# Patient Record
Sex: Female | Born: 1960 | Race: White | Hispanic: No | Smoking: Never smoker
Health system: Southern US, Community
[De-identification: ages and names within clinical notes are randomized; demographics above are authoritative.]

## PROBLEM LIST (undated history)

## (undated) DIAGNOSIS — K219 Gastro-esophageal reflux disease without esophagitis: Secondary | ICD-10-CM

## (undated) DIAGNOSIS — M7661 Achilles tendinitis, right leg: Secondary | ICD-10-CM

## (undated) DIAGNOSIS — M654 Radial styloid tenosynovitis [de Quervain]: Secondary | ICD-10-CM

## (undated) DIAGNOSIS — R519 Headache, unspecified: Secondary | ICD-10-CM

## (undated) DIAGNOSIS — M1990 Unspecified osteoarthritis, unspecified site: Secondary | ICD-10-CM

## (undated) DIAGNOSIS — S62009A Unspecified fracture of navicular [scaphoid] bone of unspecified wrist, initial encounter for closed fracture: Secondary | ICD-10-CM

## (undated) DIAGNOSIS — G4730 Sleep apnea, unspecified: Secondary | ICD-10-CM

## (undated) DIAGNOSIS — S52131A Displaced fracture of neck of right radius, initial encounter for closed fracture: Secondary | ICD-10-CM

## (undated) DIAGNOSIS — I10 Essential (primary) hypertension: Secondary | ICD-10-CM

## (undated) DIAGNOSIS — R7303 Prediabetes: Secondary | ICD-10-CM

## (undated) DIAGNOSIS — M7710 Lateral epicondylitis, unspecified elbow: Secondary | ICD-10-CM

## (undated) DIAGNOSIS — M7541 Impingement syndrome of right shoulder: Secondary | ICD-10-CM

## (undated) DIAGNOSIS — C50919 Malignant neoplasm of unspecified site of unspecified female breast: Secondary | ICD-10-CM

## (undated) DIAGNOSIS — G5762 Lesion of plantar nerve, left lower limb: Secondary | ICD-10-CM

## (undated) DIAGNOSIS — R0600 Dyspnea, unspecified: Secondary | ICD-10-CM

## (undated) DIAGNOSIS — M65332 Trigger finger, left middle finger: Secondary | ICD-10-CM

## (undated) DIAGNOSIS — J189 Pneumonia, unspecified organism: Secondary | ICD-10-CM

## (undated) DIAGNOSIS — Z8489 Family history of other specified conditions: Secondary | ICD-10-CM

## (undated) DIAGNOSIS — C50211 Malignant neoplasm of upper-inner quadrant of right female breast: Secondary | ICD-10-CM

## (undated) DIAGNOSIS — Z87442 Personal history of urinary calculi: Secondary | ICD-10-CM

## (undated) HISTORY — PX: TENOTOMY HAND / FINGER PERCUTANEOUS: SUR1344

## (undated) HISTORY — PX: APPENDECTOMY: SHX54

## (undated) HISTORY — DX: Essential (primary) hypertension: I10

## (undated) HISTORY — PX: WRIST CAPSULOTOMY: SUR192

## (undated) HISTORY — PX: WRIST FRACTURE SURGERY: SHX121

## (undated) HISTORY — PX: JOINT REPLACEMENT: SHX530

## (undated) HISTORY — PX: LITHOTRIPSY: SUR834

## (undated) HISTORY — PX: TUBAL LIGATION: SHX77

---
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
## 2004-10-23 ENCOUNTER — Ambulatory Visit: Payer: Self-pay

## 2007-01-15 ENCOUNTER — Ambulatory Visit: Payer: Self-pay

## 2008-11-13 ENCOUNTER — Ambulatory Visit: Payer: Self-pay | Admitting: Internal Medicine

## 2011-01-10 ENCOUNTER — Ambulatory Visit: Payer: Self-pay | Admitting: Family Medicine

## 2012-05-05 ENCOUNTER — Ambulatory Visit: Payer: Self-pay | Admitting: Family Medicine

## 2012-05-08 DIAGNOSIS — M7710 Lateral epicondylitis, unspecified elbow: Secondary | ICD-10-CM | POA: Insufficient documentation

## 2012-05-08 DIAGNOSIS — M25519 Pain in unspecified shoulder: Secondary | ICD-10-CM | POA: Insufficient documentation

## 2012-07-10 DIAGNOSIS — M7541 Impingement syndrome of right shoulder: Secondary | ICD-10-CM | POA: Insufficient documentation

## 2012-07-29 ENCOUNTER — Emergency Department: Payer: Self-pay | Admitting: Emergency Medicine

## 2013-05-27 DIAGNOSIS — M25649 Stiffness of unspecified hand, not elsewhere classified: Secondary | ICD-10-CM | POA: Insufficient documentation

## 2014-08-12 DIAGNOSIS — M65332 Trigger finger, left middle finger: Secondary | ICD-10-CM | POA: Insufficient documentation

## 2014-08-26 HISTORY — PX: BREAST BIOPSY: SHX20

## 2014-09-14 ENCOUNTER — Ambulatory Visit: Payer: Self-pay | Admitting: Family Medicine

## 2014-09-26 ENCOUNTER — Ambulatory Visit: Payer: Self-pay | Admitting: Family Medicine

## 2014-10-03 ENCOUNTER — Ambulatory Visit: Payer: Self-pay | Admitting: Family Medicine

## 2014-12-19 LAB — SURGICAL PATHOLOGY

## 2015-05-25 DIAGNOSIS — Z87442 Personal history of urinary calculi: Secondary | ICD-10-CM | POA: Insufficient documentation

## 2015-10-16 ENCOUNTER — Other Ambulatory Visit: Payer: Self-pay | Admitting: Family Medicine

## 2015-10-16 DIAGNOSIS — R928 Other abnormal and inconclusive findings on diagnostic imaging of breast: Secondary | ICD-10-CM

## 2015-11-01 ENCOUNTER — Ambulatory Visit
Admission: RE | Admit: 2015-11-01 | Discharge: 2015-11-01 | Disposition: A | Discharge status: Home / Self Care | Payer: 59 | Source: Ambulatory Visit | Attending: Family Medicine | Admitting: Family Medicine

## 2015-11-01 ENCOUNTER — Other Ambulatory Visit: Payer: Self-pay | Admitting: Family Medicine

## 2015-11-01 DIAGNOSIS — R928 Other abnormal and inconclusive findings on diagnostic imaging of breast: Secondary | ICD-10-CM

## 2015-11-01 DIAGNOSIS — N63 Unspecified lump in breast: Secondary | ICD-10-CM | POA: Diagnosis not present

## 2017-03-03 ENCOUNTER — Other Ambulatory Visit: Payer: Self-pay | Admitting: Family Medicine

## 2017-03-03 DIAGNOSIS — Z1231 Encounter for screening mammogram for malignant neoplasm of breast: Secondary | ICD-10-CM

## 2017-03-13 ENCOUNTER — Ambulatory Visit
Admission: RE | Admit: 2017-03-13 | Discharge: 2017-03-13 | Disposition: A | Discharge status: Home / Self Care | Payer: BLUE CROSS/BLUE SHIELD | Source: Ambulatory Visit | Attending: Family Medicine | Admitting: Family Medicine

## 2017-03-13 DIAGNOSIS — Z1231 Encounter for screening mammogram for malignant neoplasm of breast: Secondary | ICD-10-CM | POA: Diagnosis present

## 2017-11-21 ENCOUNTER — Emergency Department
Admission: EM | Admit: 2017-11-21 | Discharge: 2017-11-21 | Disposition: A | Discharge status: Home / Self Care | Payer: No Typology Code available for payment source | Attending: Emergency Medicine | Admitting: Emergency Medicine

## 2017-11-21 ENCOUNTER — Encounter: Payer: Self-pay | Admitting: Emergency Medicine

## 2017-11-21 ENCOUNTER — Other Ambulatory Visit: Payer: Self-pay

## 2017-11-21 ENCOUNTER — Emergency Department: Payer: No Typology Code available for payment source

## 2017-11-21 DIAGNOSIS — W230XXA Caught, crushed, jammed, or pinched between moving objects, initial encounter: Secondary | ICD-10-CM | POA: Diagnosis not present

## 2017-11-21 DIAGNOSIS — Y9289 Other specified places as the place of occurrence of the external cause: Secondary | ICD-10-CM | POA: Insufficient documentation

## 2017-11-21 DIAGNOSIS — S60222A Contusion of left hand, initial encounter: Secondary | ICD-10-CM

## 2017-11-21 DIAGNOSIS — S6992XA Unspecified injury of left wrist, hand and finger(s), initial encounter: Secondary | ICD-10-CM | POA: Diagnosis present

## 2017-11-21 DIAGNOSIS — Y9389 Activity, other specified: Secondary | ICD-10-CM | POA: Diagnosis not present

## 2017-11-21 DIAGNOSIS — Y999 Unspecified external cause status: Secondary | ICD-10-CM | POA: Diagnosis not present

## 2017-11-21 MED ORDER — IBUPROFEN 800 MG PO TABS
800.0000 mg | ORAL_TABLET | Freq: Once | ORAL | Status: AC
Start: 2017-11-21 — End: 2017-11-21
  Administered 2017-11-21: 800 mg via ORAL
  Filled 2017-11-21: qty 1

## 2017-11-21 NOTE — ED Triage Notes (Signed)
Patient ambulatory to triage with steady gait, without difficulty or distress noted; pt reports injuring left hand while at work; st pain to base of thumb; pt employeed with [employer] ([employer] required per profile)

## 2017-11-21 NOTE — ED Notes (Signed)
Urine collected and released to lab following chain of custody protocol for [facility] pickup.  Specimen id no [id number]

## 2017-11-21 NOTE — ED Provider Notes (Signed)
[facility] Emergency Department Provider Note   ____________________________________________   First MD Initiated Contact with Patient 11/21/17 [phone]     (approximate)  I have reviewed the triage vital signs and the nursing notes.   HISTORY  Chief Complaint Hand Injury    HPI Lisa Jennings is a 57 y.o. female who presents to the ED from work with a chief complaint of left hand injury/pain.  Patient is right-hand dominant.  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  Complains of pain and swelling to her thenar eminence.  Denies other injuries.   Past medical history Kidney stones  There are no active problems to display for this patient.   Past Surgical History:  Procedure Laterality Date  . APPENDECTOMY    . BREAST BIOPSY Right 2016   neg/ bx -clip  . LITHOTRIPSY    . TUBAL LIGATION    . WRIST FRACTURE SURGERY      Prior to Admission medications   Not on File    Allergies Patient has no known allergies.  Family History  Problem Relation Age of Onset  . Breast cancer Maternal Aunt 64    Social History Social History   Tobacco Use  . Smoking status: Never Smoker  . Smokeless tobacco: Never Used  Substance Use Topics  . Alcohol use: Not on file  . Drug use: Not on file    Review of Systems  Constitutional: No fever/chills. Eyes: No visual changes. ENT: No sore throat. Cardiovascular: Denies chest pain. Respiratory: Denies shortness of breath. Gastrointestinal: No abdominal pain.  No nausea, no vomiting.  No diarrhea.  No constipation. Genitourinary: Negative for dysuria. Musculoskeletal: Positive for left hand pain/injury.  Negative for back pain. Skin: Negative for rash. Neurological: Negative for headaches, focal weakness or numbness.   ____________________________________________   PHYSICAL EXAM:  VITAL SIGNS: ED Triage Vitals  Enc Vitals Group     BP  11/21/17 0324 (!) 165/83     Pulse Rate 11/21/17 0324 82     Resp 11/21/17 0324 20     Temp 11/21/17 0324 97.9 F (36.6 C)     Temp Source 11/21/17 0324 Oral     SpO2 11/21/17 0324 99 %     Weight 11/21/17 0321 220 lb (99.8 kg)     Height 11/21/17 0321 5\' 4"  (1.626 m)     Head Circumference --      Peak Flow --      Pain Score 11/21/17 0321 10     Pain Loc --      Pain Edu? --      Excl. in [place]? --     Constitutional: Alert and oriented. Well appearing and in no acute distress. Eyes: Conjunctivae are normal. PERRL. EOMI. Head: Atraumatic. Nose: No congestion/rhinnorhea. Mouth/Throat: Mucous membranes are moist.  Oropharynx non-erythematous. Neck: No stridor.  No cervical spine tenderness to palpation. Cardiovascular: Normal rate, regular rhythm. Grossly normal heart sounds.  Good peripheral circulation. Respiratory: Normal respiratory effort.  No retractions. Lungs CTAB. Gastrointestinal: Soft and nontender. No distention. No abdominal bruits. No CVA tenderness. Musculoskeletal:  Left palm: Mild swelling and redness to thenar eminence.  No break in skin, no lacerations or abrasions.  Limited range of motion of left thumb secondary to pain.  2+ radial pulse.  Brisk, less than 5-second capillary refill. Neurologic:  Normal speech and language. No gross focal neurologic deficits are appreciated. No gait instability. Skin:  Skin is warm, dry and intact. No rash noted. Psychiatric: Mood and affect are normal. Speech and behavior are normal.  ____________________________________________   LABS (all labs ordered are listed, but only abnormal results are displayed)  Labs Reviewed - No data to display ____________________________________________  EKG  None ____________________________________________  RADIOLOGY  ED MD interpretation: No acute fracture or dislocation  Official radiology report(s): Dg Hand Complete Left  Result Date: 11/21/2017 CLINICAL DATA:  57 year old  female with pain at the base of the left thumb. EXAM: LEFT HAND - COMPLETE 3+ VIEW COMPARISON:  Left wrist radiograph dated 07/29/2012 FINDINGS: There is no acute fracture or dislocation. Old healed fracture of the distal radius. The bones are osteopenic. No significant arthritic changes. The soft tissues appear unremarkable. IMPRESSION: 1. No acute findings. 2. Osteopenia and old healed distal radial fracture. Electronically Signed   By: Anner Crete M.D.   On: 11/21/2017 03:57    ____________________________________________   PROCEDURES  Procedure(s) performed: None  Procedures  Critical Care performed: No  ____________________________________________   INITIAL IMPRESSION / ASSESSMENT AND PLAN / ED COURSE  As part of my medical decision making, I reviewed the following data within the [place] notes reviewed and incorporated, Radiograph reviewed and Notes from prior ED visits   57 year old female who presents with left hand contusion/injury occurred at work.  X-ray negative for acute fracture or dislocation.  Clinical Course as of Nov 21 409  Fri Nov 21, 2017  0408 Updated patient of x-ray result.  Will administer NSAIDs, Ace wrap and patient will follow-up with orthopedics as needed.  Strict return precautions given.  Patient verbalizes understanding and agrees with plan of care.   [JS]    Clinical Course User Index [JS] Paulette Blanch, MD     ____________________________________________   FINAL CLINICAL IMPRESSION(S) / ED DIAGNOSES  Final diagnoses:  Contusion of left hand, initial encounter     ED Discharge Orders    None       Note:  This document was prepared using Dragon voice recognition software and may include unintentional dictation errors.    Paulette Blanch, MD 11/21/17 [phone]

## 2017-11-21 NOTE — Discharge Instructions (Signed)
1.  You may take Tylenol and/or Ibuprofen as needed for pain. 2.  You may remove Ace wrap as needed. 3.  Return to the ER for worsening symptoms, increased pain/swelling or other concerns.

## 2017-11-21 NOTE — ED Notes (Signed)

## 2018-03-11 ENCOUNTER — Other Ambulatory Visit: Payer: Self-pay | Admitting: Family Medicine

## 2018-03-11 DIAGNOSIS — Z1231 Encounter for screening mammogram for malignant neoplasm of breast: Secondary | ICD-10-CM

## 2018-03-25 ENCOUNTER — Ambulatory Visit
Admission: RE | Admit: 2018-03-25 | Discharge: 2018-03-25 | Disposition: A | Discharge status: Home / Self Care | Payer: BLUE CROSS/BLUE SHIELD | Source: Ambulatory Visit | Attending: Family Medicine | Admitting: Family Medicine

## 2018-03-25 DIAGNOSIS — Z1231 Encounter for screening mammogram for malignant neoplasm of breast: Secondary | ICD-10-CM

## 2018-05-26 DIAGNOSIS — I10 Essential (primary) hypertension: Secondary | ICD-10-CM | POA: Insufficient documentation

## 2018-08-26 DIAGNOSIS — Z923 Personal history of irradiation: Secondary | ICD-10-CM

## 2018-08-26 HISTORY — DX: Personal history of irradiation: Z92.3

## 2019-04-08 ENCOUNTER — Other Ambulatory Visit: Payer: Self-pay | Admitting: Family Medicine

## 2019-04-08 DIAGNOSIS — Z1231 Encounter for screening mammogram for malignant neoplasm of breast: Secondary | ICD-10-CM

## 2019-05-04 ENCOUNTER — Ambulatory Visit
Admission: RE | Admit: 2019-05-04 | Discharge: 2019-05-04 | Disposition: A | Discharge status: Home / Self Care | Payer: 59 | Source: Ambulatory Visit | Attending: Family Medicine | Admitting: Family Medicine

## 2019-05-04 ENCOUNTER — Other Ambulatory Visit: Payer: Self-pay

## 2019-05-04 DIAGNOSIS — Z1231 Encounter for screening mammogram for malignant neoplasm of breast: Secondary | ICD-10-CM | POA: Diagnosis present

## 2019-05-06 ENCOUNTER — Other Ambulatory Visit: Payer: Self-pay | Admitting: Family Medicine

## 2019-05-06 DIAGNOSIS — R928 Other abnormal and inconclusive findings on diagnostic imaging of breast: Secondary | ICD-10-CM

## 2019-05-06 DIAGNOSIS — N6310 Unspecified lump in the right breast, unspecified quadrant: Secondary | ICD-10-CM

## 2019-05-14 ENCOUNTER — Ambulatory Visit
Admission: RE | Admit: 2019-05-14 | Discharge: 2019-05-14 | Disposition: A | Discharge status: Home / Self Care | Payer: 59 | Source: Ambulatory Visit | Attending: Family Medicine | Admitting: Family Medicine

## 2019-05-14 ENCOUNTER — Other Ambulatory Visit: Payer: Self-pay

## 2019-05-14 DIAGNOSIS — R928 Other abnormal and inconclusive findings on diagnostic imaging of breast: Secondary | ICD-10-CM

## 2019-05-14 DIAGNOSIS — N6310 Unspecified lump in the right breast, unspecified quadrant: Secondary | ICD-10-CM | POA: Diagnosis present

## 2019-05-17 ENCOUNTER — Other Ambulatory Visit: Payer: Self-pay | Admitting: Family Medicine

## 2019-05-17 DIAGNOSIS — R928 Other abnormal and inconclusive findings on diagnostic imaging of breast: Secondary | ICD-10-CM

## 2019-05-17 DIAGNOSIS — N6489 Other specified disorders of breast: Secondary | ICD-10-CM

## 2019-05-21 ENCOUNTER — Other Ambulatory Visit: Payer: Self-pay | Admitting: Family Medicine

## 2019-05-21 DIAGNOSIS — N6489 Other specified disorders of breast: Secondary | ICD-10-CM

## 2019-05-21 DIAGNOSIS — R928 Other abnormal and inconclusive findings on diagnostic imaging of breast: Secondary | ICD-10-CM

## 2019-05-21 DIAGNOSIS — N6310 Unspecified lump in the right breast, unspecified quadrant: Secondary | ICD-10-CM

## 2019-05-28 ENCOUNTER — Ambulatory Visit
Admission: RE | Admit: 2019-05-28 | Discharge: 2019-05-28 | Disposition: A | Discharge status: Home / Self Care | Payer: No Typology Code available for payment source | Source: Ambulatory Visit | Attending: Family Medicine | Admitting: Family Medicine

## 2019-05-28 DIAGNOSIS — N6310 Unspecified lump in the right breast, unspecified quadrant: Secondary | ICD-10-CM | POA: Diagnosis present

## 2019-05-28 DIAGNOSIS — N6489 Other specified disorders of breast: Secondary | ICD-10-CM | POA: Diagnosis present

## 2019-05-28 DIAGNOSIS — R928 Other abnormal and inconclusive findings on diagnostic imaging of breast: Secondary | ICD-10-CM | POA: Diagnosis not present

## 2019-05-28 HISTORY — PX: BREAST BIOPSY: SHX20

## 2019-05-31 ENCOUNTER — Other Ambulatory Visit: Payer: Self-pay | Admitting: Anatomic Pathology & Clinical Pathology

## 2019-06-01 ENCOUNTER — Other Ambulatory Visit: Payer: Self-pay

## 2019-06-01 DIAGNOSIS — C50919 Malignant neoplasm of unspecified site of unspecified female breast: Secondary | ICD-10-CM

## 2019-06-02 ENCOUNTER — Other Ambulatory Visit: Payer: Self-pay

## 2019-06-03 ENCOUNTER — Ambulatory Visit: Payer: Self-pay | Admitting: General Surgery

## 2019-06-03 ENCOUNTER — Other Ambulatory Visit: Payer: Self-pay | Admitting: General Surgery

## 2019-06-03 DIAGNOSIS — C50211 Malignant neoplasm of upper-inner quadrant of right female breast: Secondary | ICD-10-CM

## 2019-06-03 NOTE — H&P (View-Only) (Signed)
PATIENT PROFILE: Lisa Jennings is a 58 y.o. female who presents to the Clinic for consultation at the request of Dr. Hoy Morn for evaluation of right breast cancer.  PCP:  Dimas Chyle, MD  HISTORY OF PRESENT ILLNESS: [name] reports reported that she went to her screening mammogram.  Suspicious distortion was found.  This led to diagnostic mammogram and ultrasound.  Diagnostic mammogram and ultrasound confirmed a suspicious lesion on the right breast.  This led to a core biopsy.  Core biopsy shows invasive mammary carcinoma of the right breast.  There were no suspicious node on the axilla.  Family history of breast cancer: Aunt Family history of other cancers: none Menarche: 58 years old Menopause: 58 years old Used OCP: none Used estrogen and progesterone therapy: none  History of Radiation to the chest: none Number of pregnancies: 3 Previous biopsy: 1.  Biopsy in the right breast, benign.  PROBLEM LIST:         Problem List  Date Reviewed: 06/03/2019         Noted   Essential hypertension 05/26/2018   History of kidney stones 05/25/2015   Trigger middle finger of left hand 08/12/2014   Joint stiffness of hand 05/27/2013   Wrist stiffness 05/27/2013   Impingement syndrome of right shoulder 07/10/2012   Shoulder pain 05/08/2012   Lateral epicondylitis 05/08/2012   BMI 38.0-38.9,adult Unknown      GENERAL REVIEW OF SYSTEMS:   General ROS: negative for - chills, fatigue, fever, weight gain or weight loss Allergy and Immunology ROS: negative for - hives  Hematological and Lymphatic ROS: negative for - bleeding problems or bruising, negative for palpable nodes Endocrine ROS: negative for - heat or cold intolerance, hair changes Respiratory ROS: negative for - cough, shortness of breath or wheezing Cardiovascular ROS: no chest pain or palpitations GI ROS: negative for nausea, vomiting, abdominal pain, diarrhea, constipation Musculoskeletal ROS:  negative for - joint swelling or muscle pain Neurological ROS: negative for - confusion, syncope Dermatological ROS: negative for pruritus and rash Psychiatric: negative for anxiety, depression, difficulty sleeping and memory loss  MEDICATIONS: Current Medications        Current Outpatient Medications  Medication Sig Dispense Refill  . *calcium carbonate oral 1 tab by mouth daily    . acetaminophen (TYLENOL) 500 MG tablet Take 500 mg by mouth every 6 (six) hours.    Marland Kitchen aspirin 81 mg tablet 1 tab by mouth daily    . meloxicam (MOBIC) 15 MG tablet Take 1 tablet (15 mg total) by mouth once daily 90 tablet 1  . multivitamin capsule 1 cap by mouth daily    . valsartan-hydrochlorothiazide (DIOVAN-HCT) 160-12.5 mg tablet Take 1 tablet by mouth once daily 30 tablet 11   No current facility-administered medications for this visit.       ALLERGIES: Patient has no known allergies.  PAST MEDICAL HISTORY:     Past Medical History:  Diagnosis Date  . BMI 38.0-38.9,adult   . Broken wrist 07/29/2013   healing  . GERD (gastroesophageal reflux disease)    OTC meds taken prn  . Hypertension   . Kidney stones 1999   None since then  . Normal cardiac stress test    results normal per patient  . Obesity   . Wears glasses     PAST SURGICAL HISTORY:      Past Surgical History:  Procedure Laterality Date  . APPENDECTOMY    . CAPSULECTOMY/CAPSULECTOMY INTERPHALANGEAL JOINT Left 05/17/2013   Procedure:  CAPSULECTOMY / CAPSULECTOMY INTERPHALANGEAL JOINT -  WRIST & INDEX,LONG,RING,& SMALL FINGERS;  Surgeon: Sallee Provencal, MD;  Location: [facility] OR;  Service: Plastic Surgery;  Laterality: Left;  . CAPSULOTOMY WRIST Left 05/17/2013   Procedure: CAPSULOTOMY WRIST;  Surgeon: Sallee Provencal, MD;  Location: [facility] OR;  Service: Plastic Surgery;  Laterality: Left;  . kidney stone removal    . NEUROPLASTY &/OR TRANSPOSITION MEDIAN NERVE AT CARPAL TUNNEL Left  08/10/2012   Procedure: NEUROPLASTY &/OR TRANSPOSITION MEDIAN NERVE AT CARPAL TUNNEL;  Surgeon: Sallee Provencal, MD;  Location: [facility] OR;  Service: Plastic Surgery;  Laterality: Left;  . TENOTOMY FINGER Left 05/17/2013   Procedure: TENOTOMY FINGER- INDEX,LONG,RING,& SMALL FINGERS;  Surgeon: Sallee Provencal, MD;  Location: [facility] OR;  Service: Plastic Surgery;  Laterality: Left;  . TUBAL LIGATION       FAMILY HISTORY:      Family History  Problem Relation Age of Onset  . High blood pressure (Hypertension) Mother   . Diabetes type II Mother   . Osteoporosis (Thinning of bones) Mother   . Diabetes type II Sister   . High blood pressure (Hypertension) Sister   . Hyperlipidemia (Elevated cholesterol) Sister   . Coronary Artery Disease (Blocked arteries around heart) Brother 59  . Diabetes type II Brother   . Hyperlipidemia (Elevated cholesterol) Sister   . High blood pressure (Hypertension) Sister   . Thyroid disease Sister   . Diverticulitis Sister   . No Known Problems Son   . No Known Problems Son   . No Known Problems Son   . Anesthesia problems Neg Hx   . Malignant hypertension Neg Hx      SOCIAL HISTORY: Social History          Socioeconomic History  . Marital status: Divorced    Spouse name: Not on file  . Number of children: 3  . Years of education: 57  . Highest education level: Not on file  Occupational History  . Occupation: Full-time  Social Needs  . Financial resource strain: Not on file  . Food insecurity    Worry: Not on file    Inability: Not on file  . Transportation needs    Medical: Not on file    Non-medical: Not on file  Tobacco Use  . Smoking status: Never Smoker  . Smokeless tobacco: Never Used  Substance and Sexual Activity  . Alcohol use: No  . Drug use: No  . Sexual activity: Never    Partners: Male    Birth control/protection: Surgical  Other Topics Concern  . Not on file  Social History  Narrative   Divorced.  Has three sons, five grandkids.  No tob, etoh, drugs.  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  Exercise: some walking.   Wears seatbelts, sunscreen.  Dentist: yes. Calcium in diet: yes.  No relgious beliefs affecting health care.  Advanced Directives: will work on.        PHYSICAL EXAM:    Vitals:   06/03/19 0831  BP: 154/77  Pulse: 81   Body mass index is 39.86 kg/m. Weight: (!) 107 kg (235 lb 14 oz)   GENERAL: Alert, active, oriented x3  HEENT: Pupils equal reactive to light. Extraocular movements are intact. Sclera clear. Palpebral conjunctiva normal red color.Pharynx clear.  NECK: Supple with no palpable mass and no adenopathy.  LUNGS: Sound clear with no rales rhonchi or wheezes.  HEART: Regular rhythm S1 and S2 without murmur.  BREAST: breasts appear  normal, no suspicious masses, no skin or nipple changes or axillary nodes.  ABDOMEN: Soft and depressible, nontender with no palpable mass, no hepatomegaly.  EXTREMITIES: Well-developed well-nourished symmetrical with no dependent edema.  NEUROLOGICAL: Awake alert oriented, facial expression symmetrical, moving all extremities.  REVIEW OF DATA: I have reviewed the following data today:      Appointment on 04/21/2019  Component Date Value  . Sodium 04/21/2019 138   . Potassium 04/21/2019 3.7   . Chloride 04/21/2019 103   . Carbon Dioxide (CO2) 04/21/2019 23   . Urea Nitrogen (BUN) 04/21/2019 15   . Creatinine 04/21/2019 0.9   . Glucose 04/21/2019 82   . Calcium 04/21/2019 9.9   . Anion Gap 04/21/2019 12   . BUN/CREA Ratio 04/21/2019 17   . Glomerular Filtration Ra* 04/21/2019 71   Office Visit on 04/07/2019  Component Date Value  . Sodium 04/07/2019 140   . Potassium 04/07/2019 4.1   . Chloride 04/07/2019 106   . Carbon Dioxide (CO2) 04/07/2019 23   . Urea Nitrogen (BUN) 04/07/2019 17   . Creatinine 04/07/2019 0.9   . Glucose 04/07/2019 84   . Calcium 04/07/2019 10.3*  .  Anion Gap 04/07/2019 11   . BUN/CREA Ratio 04/07/2019 19   . Glomerular Filtration Ra* 04/07/2019 71      ASSESSMENT: Ms. Goldsmith is a 58 y.o. female presenting for consultation for right breast cancer.    Patient was oriented again about the pathology results. Surgical alternatives were discussed with patient including partial vs total mastectomy. Surgical technique and post operative care was discussed with patient. Risk of surgery was discussed with patient including but not limited to: wound infection, seroma, hematoma, brachial plexopathy, mondor's disease (thrombosis of small veins of breast), chronic wound pain, breast lymphedema, altered sensation to the nipple and cosmesis among others.   Patient has appointment with oncology next Monday.  ER PR HER-2 receptors still in process.  After evaluation by oncology, will recommend partial mastectomy with sentinel needle biopsy.  Malignant neoplasm of upper-inner quadrant of right female breast, unspecified estrogen receptor status (CMS-HCC) [C50.211]  PLAN: 1.  Right breast partial mastectomy with sentinel lymph node biopsy ([id number], [id number]) 2.  CBC and CMP 3.  Avoid taking aspirin 5 days before the surgery 4.  Expect oncology appointment on June 07, 2019. 5.  Contact us if you have any question or concern  Patient verbalized understanding, all questions were answered, and were agreeable with the plan outlined above.     Herbert Pun, MD  Electronically signed by Herbert Pun, MD

## 2019-06-03 NOTE — H&P (Signed)
PATIENT PROFILE: Lisa Jennings is a 58 y.o. female who presents to the Clinic for consultation at the request of Dr. Hoy Morn for evaluation of right breast cancer.  PCP:  Dimas Chyle, MD  HISTORY OF PRESENT ILLNESS: Lisa Jennings reports reported that she went to her screening mammogram.  Suspicious distortion was found.  This led to diagnostic mammogram and ultrasound.  Diagnostic mammogram and ultrasound confirmed a suspicious lesion on the right breast.  This led to a core biopsy.  Core biopsy shows invasive mammary carcinoma of the right breast.  There were no suspicious node on the axilla.  Family history of breast cancer: Aunt Family history of other cancers: none Menarche: 58 years old Menopause: 58 years old Used OCP: none Used estrogen and progesterone therapy: none  History of Radiation to the chest: none Number of pregnancies: 3 Previous biopsy: 1.  Biopsy in the right breast, benign.  PROBLEM LIST:         Problem List  Date Reviewed: 06/03/2019         Noted   Essential hypertension 05/26/2018   History of kidney stones 05/25/2015   Trigger middle finger of left hand 08/12/2014   Joint stiffness of hand 05/27/2013   Wrist stiffness 05/27/2013   Impingement syndrome of right shoulder 07/10/2012   Shoulder pain 05/08/2012   Lateral epicondylitis 05/08/2012   BMI 38.0-38.9,adult Unknown      GENERAL REVIEW OF SYSTEMS:   General ROS: negative for - chills, fatigue, fever, weight gain or weight loss Allergy and Immunology ROS: negative for - hives  Hematological and Lymphatic ROS: negative for - bleeding problems or bruising, negative for palpable nodes Endocrine ROS: negative for - heat or cold intolerance, hair changes Respiratory ROS: negative for - cough, shortness of breath or wheezing Cardiovascular ROS: no chest pain or palpitations GI ROS: negative for nausea, vomiting, abdominal pain, diarrhea, constipation Musculoskeletal ROS:  negative for - joint swelling or muscle pain Neurological ROS: negative for - confusion, syncope Dermatological ROS: negative for pruritus and rash Psychiatric: negative for anxiety, depression, difficulty sleeping and memory loss  MEDICATIONS: Current Medications        Current Outpatient Medications  Medication Sig Dispense Refill  . *calcium carbonate oral 1 tab by mouth daily    . acetaminophen (TYLENOL) 500 MG tablet Take 500 mg by mouth every 6 (six) hours.    Marland Kitchen aspirin 81 mg tablet 1 tab by mouth daily    . meloxicam (MOBIC) 15 MG tablet Take 1 tablet (15 mg total) by mouth once daily 90 tablet 1  . multivitamin capsule 1 cap by mouth daily    . valsartan-hydrochlorothiazide (DIOVAN-HCT) 160-12.5 mg tablet Take 1 tablet by mouth once daily 30 tablet 11   No current facility-administered medications for this visit.       ALLERGIES: Patient has no known allergies.  PAST MEDICAL HISTORY:     Past Medical History:  Diagnosis Date  . BMI 38.0-38.9,adult   . Broken wrist 07/29/2013   healing  . GERD (gastroesophageal reflux disease)    OTC meds taken prn  . Hypertension   . Kidney stones 1999   None since then  . Normal cardiac stress test    results normal per patient  . Obesity   . Wears glasses     PAST SURGICAL HISTORY:      Past Surgical History:  Procedure Laterality Date  . APPENDECTOMY    . CAPSULECTOMY/CAPSULECTOMY INTERPHALANGEAL JOINT Left 05/17/2013   Procedure:  CAPSULECTOMY / CAPSULECTOMY INTERPHALANGEAL JOINT -  WRIST & INDEX,LONG,RING,& SMALL FINGERS;  Surgeon: Sallee Provencal, MD;  Location: [facility];  Service: Plastic Surgery;  Laterality: Left;  . CAPSULOTOMY WRIST Left 05/17/2013   Procedure: CAPSULOTOMY WRIST;  Surgeon: Sallee Provencal, MD;  Location: [facility] OR;  Service: Plastic Surgery;  Laterality: Left;  . kidney stone removal    . NEUROPLASTY &/OR TRANSPOSITION MEDIAN NERVE AT CARPAL TUNNEL Left  08/10/2012   Procedure: NEUROPLASTY &/OR TRANSPOSITION MEDIAN NERVE AT CARPAL TUNNEL;  Surgeon: Sallee Provencal, MD;  Location: [facility];  Service: Plastic Surgery;  Laterality: Left;  . TENOTOMY FINGER Left 05/17/2013   Procedure: TENOTOMY FINGER- INDEX,LONG,RING,& SMALL FINGERS;  Surgeon: Sallee Provencal, MD;  Location: [facility];  Service: Plastic Surgery;  Laterality: Left;  . TUBAL LIGATION       FAMILY HISTORY:      Family History  Problem Relation Age of Onset  . High blood pressure (Hypertension) Mother   . Diabetes type II Mother   . Osteoporosis (Thinning of bones) Mother   . Diabetes type II Sister   . High blood pressure (Hypertension) Sister   . Hyperlipidemia (Elevated cholesterol) Sister   . Coronary Artery Disease (Blocked arteries around heart) Brother 59  . Diabetes type II Brother   . Hyperlipidemia (Elevated cholesterol) Sister   . High blood pressure (Hypertension) Sister   . Thyroid disease Sister   . Diverticulitis Sister   . No Known Problems Son   . No Known Problems Son   . No Known Problems Son   . Anesthesia problems Neg Hx   . Malignant hypertension Neg Hx      SOCIAL HISTORY: Social History          Socioeconomic History  . Marital status: Divorced    Spouse name: Not on file  . Number of children: 3  . Years of education: 57  . Highest education level: Not on file  Occupational History  . Occupation: Full-time  Social Needs  . Financial resource strain: Not on file  . Food insecurity    Worry: Not on file    Inability: Not on file  . Transportation needs    Medical: Not on file    Non-medical: Not on file  Tobacco Use  . Smoking status: Never Smoker  . Smokeless tobacco: Never Used  Substance and Sexual Activity  . Alcohol use: No  . Drug use: No  . Sexual activity: Never    Partners: Male    Birth control/protection: Surgical  Other Topics Concern  . Not on file  Social History  Narrative   Divorced.  Has three sons, five grandkids.  No tob, etoh, drugs.  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  Exercise: some walking.   Wears seatbelts, sunscreen.  Dentist: yes. Calcium in diet: yes.  No relgious beliefs affecting health care.  Advanced Directives: will work on.        PHYSICAL EXAM:    Vitals:   06/03/19 0831  BP: 154/77  Pulse: 81   Body mass index is 39.86 kg/m. Weight: (!) 107 kg (235 lb 14 oz)   GENERAL: Alert, active, oriented x3  HEENT: Pupils equal reactive to light. Extraocular movements are intact. Sclera clear. Palpebral conjunctiva normal red color.Pharynx clear.  NECK: Supple with no palpable mass and no adenopathy.  LUNGS: Sound clear with no rales rhonchi or wheezes.  HEART: Regular rhythm S1 and S2 without murmur.  BREAST: breasts appear  normal, no suspicious masses, no skin or nipple changes or axillary nodes.  ABDOMEN: Soft and depressible, nontender with no palpable mass, no hepatomegaly.  EXTREMITIES: Well-developed well-nourished symmetrical with no dependent edema.  NEUROLOGICAL: Awake alert oriented, facial expression symmetrical, moving all extremities.  REVIEW OF DATA: I have reviewed the following data today:      Appointment on 04/21/2019  Component Date Value  . Sodium 04/21/2019 138   . Potassium 04/21/2019 3.7   . Chloride 04/21/2019 103   . Carbon Dioxide (CO2) 04/21/2019 23   . Urea Nitrogen (BUN) 04/21/2019 15   . Creatinine 04/21/2019 0.9   . Glucose 04/21/2019 82   . Calcium 04/21/2019 9.9   . Anion Gap 04/21/2019 12   . BUN/CREA Ratio 04/21/2019 17   . Glomerular Filtration Ra* 04/21/2019 71   Office Visit on 04/07/2019  Component Date Value  . Sodium 04/07/2019 140   . Potassium 04/07/2019 4.1   . Chloride 04/07/2019 106   . Carbon Dioxide (CO2) 04/07/2019 23   . Urea Nitrogen (BUN) 04/07/2019 17   . Creatinine 04/07/2019 0.9   . Glucose 04/07/2019 84   . Calcium 04/07/2019 10.3*  .  Anion Gap 04/07/2019 11   . BUN/CREA Ratio 04/07/2019 19   . Glomerular Filtration Ra* 04/07/2019 71      ASSESSMENT: Ms. Goldsmith is a 58 y.o. female presenting for consultation for right breast cancer.    Patient was oriented again about the pathology results. Surgical alternatives were discussed with patient including partial vs total mastectomy. Surgical technique and post operative care was discussed with patient. Risk of surgery was discussed with patient including but not limited to: wound infection, seroma, hematoma, brachial plexopathy, mondor's disease (thrombosis of small veins of breast), chronic wound pain, breast lymphedema, altered sensation to the nipple and cosmesis among others.   Patient has appointment with oncology next Monday.  ER PR HER-2 receptors still in process.  After evaluation by oncology, will recommend partial mastectomy with sentinel needle biopsy.  Malignant neoplasm of upper-inner quadrant of right female breast, unspecified estrogen receptor status (CMS-HCC) [C50.211]  PLAN: 1.  Right breast partial mastectomy with sentinel lymph node biopsy (19301, [id number]) 2.  CBC and CMP 3.  Avoid taking aspirin 5 days before the surgery 4.  Expect oncology appointment on June 07, 2019. 5.  Contact us if you have any question or concern  Patient verbalized understanding, all questions were answered, and were agreeable with the plan outlined above.     Herbert Pun, MD  Electronically signed by Herbert Pun, MD

## 2019-06-04 ENCOUNTER — Other Ambulatory Visit: Payer: Self-pay

## 2019-06-04 ENCOUNTER — Other Ambulatory Visit: Payer: Self-pay | Admitting: General Surgery

## 2019-06-04 DIAGNOSIS — C50211 Malignant neoplasm of upper-inner quadrant of right female breast: Secondary | ICD-10-CM

## 2019-06-04 LAB — SURGICAL PATHOLOGY

## 2019-06-07 ENCOUNTER — Inpatient Hospital Stay: Payer: 59 | Attending: Internal Medicine | Admitting: Internal Medicine

## 2019-06-07 ENCOUNTER — Encounter: Payer: Self-pay | Admitting: Internal Medicine

## 2019-06-07 ENCOUNTER — Inpatient Hospital Stay: Payer: 59

## 2019-06-07 ENCOUNTER — Other Ambulatory Visit: Payer: Self-pay

## 2019-06-07 DIAGNOSIS — Z170 Estrogen receptor positive status [ER+]: Secondary | ICD-10-CM

## 2019-06-07 DIAGNOSIS — M549 Dorsalgia, unspecified: Secondary | ICD-10-CM | POA: Insufficient documentation

## 2019-06-07 DIAGNOSIS — C50211 Malignant neoplasm of upper-inner quadrant of right female breast: Secondary | ICD-10-CM

## 2019-06-07 DIAGNOSIS — Z7982 Long term (current) use of aspirin: Secondary | ICD-10-CM | POA: Insufficient documentation

## 2019-06-07 DIAGNOSIS — Z79899 Other long term (current) drug therapy: Secondary | ICD-10-CM | POA: Insufficient documentation

## 2019-06-07 DIAGNOSIS — M2550 Pain in unspecified joint: Secondary | ICD-10-CM | POA: Diagnosis not present

## 2019-06-07 DIAGNOSIS — Z791 Long term (current) use of non-steroidal anti-inflammatories (NSAID): Secondary | ICD-10-CM | POA: Diagnosis not present

## 2019-06-07 HISTORY — DX: Malignant neoplasm of upper-inner quadrant of right female breast: C50.211

## 2019-06-07 NOTE — Research (Signed)
I met with patient todayafter her appointment with Dr. Rogue Bussing to review study "Procurement of Human Biospecimens for the Discovery and Validation of Biomarkers for the Predication, Diagnosis and Management of Disease" sponsored by [employer]. Dr. Rogue Bussing discussed the study with the patient prior to my entry into the patient room.  I further explained the study to patient including purpose of the study, risks/benefits, participation requirements, voluntary participation and reimbursement provided by the study. Patient agreeable to participation. Patient signed consent and HIPPA for study and copy of signed forms given to patient. Per study guidelines, patient was asked if she had Novocain in the past week or had any history of cancer. Patient answered no to both of these questions. (If the patient were to have answered yes, she would be disqualified from study). Lab appointment was scheduled for today after it was explained to patient that physician does not need any lab work and the venipuncture would be only for research purposes.  Patient agreeable to lab draw.  Blood collected for research study and patient was given gift card provided by [facility].  Patient was thanked for her participation.   Lula Olszewski Clinical oncology research associate 06/07/2019 1334

## 2019-06-07 NOTE — Assessment & Plan Note (Addendum)
#  Clinical stage I ER PR positive HER-2 negative breast cancer.   #  I had a long discussion with the patient in general regarding the treatment options of breast cancer including-surgery; adjuvant radiation; role of adjuvant systemic therapy including-chemotherapy antihormone therapy. Patient will need lumpectomy with sentinel lymph node evaluation; followed by radiation.  Patient awaiting surgery plan on October 16.  # Based on preoperative clinical characteristics-it is unlikely the patient will need chemotherapy.  Decision regarding chemotherapy based on final surgical pathology/gene assay. Patient will benefit from antihormone therapy.  I discussed the potential benefits of each option; and also potential downsides in detail.  Also discussed the tumor conference.  #Discussed with the patient the available clinical trial- the [employer] study (the blood study) to this patient.  This is a noninterventional study.  Patient interested.  Patient will have labs done today.  Discussed with Marlowe Kays.  # Thank you for allowing me to participate in the care of your pleasant patient. Please do not hesitate to contact me with questions or concerns in the interim. Discussed with Dr.Cintron.   # DISPOSITION:  # labs/clinical trial today # follow up 3 weeks- MD; no labs- Dr.B

## 2019-06-07 NOTE — Progress Notes (Signed)
Patient here today for new consult for breast cancer.

## 2019-06-07 NOTE — Progress Notes (Signed)
[facility] NOTE  Patient Care Team: Hortencia Pilar, MD as PCP - General (Family Medicine)  CHIEF COMPLAINTS/PURPOSE OF CONSULTATION: Breast cancer  #  Oncology History Overview Note  # RIGHT BREAST, UPPER INNER QUADRANT; STEREOTACTIC-GUIDED CORE BIOPSY: - INVASIVE MAMMARY CARCINOMA, NO SPECIAL TYPE.   Size of invasive carcinoma: 5 mm in this sample  Histologic grade of invasive carcinoma: Grade 1                       Glandular/tubular differentiation score: 3                       Nuclear pleomorphism score: 1                       Mitotic rate score: 1                       Total score: 5  Ductal carcinoma in situ: Not identified  Lymphovascular invasion: Not identified   DIAGNOSIS: Right breast cancer  STAGE:    1     ;  GOALS: Cure  CURRENT/MOST RECENT THERAPY : awaiting surgery.     Carcinoma of upper-inner quadrant of right breast in female, estrogen receptor positive ([place])  06/07/2019 Initial Diagnosis   Carcinoma of upper-inner quadrant of right breast in female, estrogen receptor positive ([place])      HISTORY OF PRESENTING ILLNESS:  [name] 58 y.o.  female female with no prior history of breast cancer/or malignancies has been referred to [place] for further evaluation recommendations for new diagnosis of breast cancer.   Patient states she was found to have an abnormal screening mammogram which led to diagnostic mammogram/ultrasound/followed by biopsy-as summarized above.  Patient has chronic mild joint pains back pain.  Not any worse.  No new shortness of breath or new cough.   Review of Systems  Constitutional: Negative for chills, diaphoresis, fever, malaise/fatigue and weight loss.  HENT: Negative for nosebleeds and sore throat.   Eyes: Negative for double vision.  Respiratory: Negative for cough, hemoptysis, sputum production, shortness of breath and wheezing.   Cardiovascular: Negative for chest pain, palpitations, orthopnea  and leg swelling.  Gastrointestinal: Negative for abdominal pain, blood in stool, constipation, diarrhea, heartburn, melena, nausea and vomiting.  Genitourinary: Negative for dysuria, frequency and urgency.  Musculoskeletal: Positive for back pain and joint pain.  Skin: Negative.  Negative for itching and rash.  Neurological: Negative for dizziness, tingling, focal weakness, weakness and headaches.  Endo/Heme/Allergies: Does not bruise/bleed easily.  Psychiatric/Behavioral: Negative for depression. The patient is not nervous/anxious and does not have insomnia.      MEDICAL HISTORY:  Past Medical History:  Diagnosis Date  . Carcinoma of upper-inner quadrant of right breast in female, estrogen receptor positive ([place]) 06/07/2019  . Hypertension     SURGICAL HISTORY: Past Surgical History:  Procedure Laterality Date  . APPENDECTOMY    . BREAST BIOPSY Right 2016   neg/ bx -clip  . BREAST BIOPSY Right 05/28/2019   x clip, stereo bx, pending path   . LITHOTRIPSY    . TUBAL LIGATION    . WRIST FRACTURE SURGERY      SOCIAL HISTORY: Social History   Socioeconomic History  . Marital status: Divorced    Spouse name: Not on file  . Number of children: Not on file  . Years of education: Not on file  .  Highest education level: Not on file  Occupational History  . Not on file  Social Needs  . Financial resource strain: Not on file  . Food insecurity    Worry: Not on file    Inability: Not on file  . Transportation needs    Medical: Not on file    Non-medical: Not on file  Tobacco Use  . Smoking status: Never Smoker  . Smokeless tobacco: Never Used  Substance and Sexual Activity  . Alcohol use: Never    Frequency: Never  . Drug use: Never  . Sexual activity: Not Currently  Lifestyle  . Physical activity    Days per week: Not on file    Minutes per session: Not on file  . Stress: Not on file  Relationships  . Social Herbalist on phone: Not on file    Gets  together: Not on file    Attends religious service: Not on file    Active member of club or organization: Not on file    Attends meetings of clubs or organizations: Not on file    Relationship status: Not on file  . Intimate partner violence    Fear of current or ex partner: Not on file    Emotionally abused: Not on file    Physically abused: Not on file    Forced sexual activity: Not on file  Other Topics Concern  . Not on file  Social History Narrative   Lives in [place]/ with brother/ son& family. 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     FAMILY HISTORY: Family History  Problem Relation Age of Onset  . Breast cancer Maternal Aunt 64  . Diabetes Mother     ALLERGIES:  has No Known Allergies.  MEDICATIONS:  Current Outpatient Medications  Medication Sig Dispense Refill  . acetaminophen (TYLENOL) 500 MG tablet Take 1,000 mg by mouth every 6 (six) hours as needed for moderate pain.    Marland Kitchen aspirin EC 81 MG tablet Take 81 mg by mouth daily.    . Calcium Carb-Cholecalciferol (CALCIUM 600+D) 600-800 MG-UNIT TABS Take 1 tablet by mouth daily.    . meloxicam (MOBIC) 15 MG tablet Take 15 mg by mouth daily.    . Multiple Vitamins-Minerals (CENTRUM SILVER ADULT 50+ PO) Take 1 tablet by mouth daily.    . valsartan-hydrochlorothiazide (DIOVAN-HCT) 160-12.5 MG tablet Take 1 tablet by mouth daily.     No current facility-administered medications for this visit.       Marland Kitchen  PHYSICAL EXAMINATION: ECOG PERFORMANCE STATUS: 0 - Asymptomatic  Vitals:   06/07/19 1138  BP: (!) 143/63  Pulse: 76  Temp: 98.1 F (36.7 C)   Filed Weights   06/07/19 1138  Weight: 239 lb 6.4 oz (108.6 kg)    Physical Exam  Constitutional: She is oriented to person, place, and time and well-developed, well-nourished, and in no distress.  HENT:  Head: Normocephalic and atraumatic.  Mouth/Throat: Oropharynx is clear and moist. No oropharyngeal exudate.  Eyes: Pupils are equal, round, and reactive  to light.  Neck: Normal range of motion. Neck supple.  Cardiovascular: Normal rate and regular rhythm.  Pulmonary/Chest: No respiratory distress. She has no wheezes.  Abdominal: Soft. Bowel sounds are normal. She exhibits no distension and no mass. There is no abdominal tenderness. There is no rebound and no guarding.  Musculoskeletal: Normal range of motion.        General: No tenderness or edema.  Neurological: She is alert and  oriented to person, place, and time.  Skin: Skin is warm.  Psychiatric: Affect normal.     LABORATORY DATA:  I have reviewed the data as listed No results found for: WBC, HGB, HCT, MCV, PLT No results for input(s): NA, K, CL, CO2, GLUCOSE, BUN, CREATININE, CALCIUM, GFRNONAA, GFRAA, PROT, ALBUMIN, AST, ALT, ALKPHOS, BILITOT, BILIDIR, IBILI in the last 8760 hours.  RADIOGRAPHIC STUDIES: I have personally reviewed the radiological images as listed and agreed with the findings in the report. US Breast Ltd Uni Right Inc [place]  Result Date: 05/14/2019 CLINICAL DATA:  Recall from screening mammography with tomosynthesis, possible mass or developing asymmetry involving the INNER RIGHT breast at MIDDLE to POSTERIOR depth EXAM: DIGITAL DIAGNOSTIC RIGHT MAMMOGRAM WITH TOMO ULTRASOUND RIGHT BREAST COMPARISON:  Previous exam(s). ACR Breast Density Category b: There are scattered areas of fibroglandular density. FINDINGS: Tomosynthesis and synthesized spot-compression CC and MLO views of the area of concern in the RIGHT breast were obtained. Spot compression images confirm a mass or focal asymmetry with vague margins and possible subtle architectural distortion in the INNER breast at MIDDLE to POSTERIOR depth, measuring approximately 1.5 cm. It is most conspicuous on the spot compression CC images. On correlative physical examination, there is no palpable abnormality in the INNER RIGHT breast. Targeted RIGHT breast ultrasound is performed, showing no sonographic correlate for the  mammographic mass or asymmetry. Imaging was performed from 1 o'clock through 5 o'clock. IMPRESSION: Suspicious mass or focal asymmetry involving the INNER RIGHT breast at MIDDLE depth without sonographic correlate. RECOMMENDATION: Stereotactic tomosynthesis core needle biopsy of the focal asymmetry or mass involving the RIGHT breast. The stereotactic tomosynthesis biopsy procedure was discussed with patient and her questions were answered. She has agreed to proceed. She will be contacted by the staff at the [facility] and the biopsy will be scheduled at her convenience. I have discussed the findings and recommendations with the patient. BI-RADS CATEGORY  4: Suspicious. Electronically Signed   By: Evangeline Dakin M.D.   On: 05/14/2019 16:14   Mm Diag Breast Tomo Uni Right  Result Date: 05/14/2019 CLINICAL DATA:  Recall from screening mammography with tomosynthesis, possible mass or developing asymmetry involving the INNER RIGHT breast at MIDDLE to POSTERIOR depth EXAM: DIGITAL DIAGNOSTIC RIGHT MAMMOGRAM WITH TOMO ULTRASOUND RIGHT BREAST COMPARISON:  Previous exam(s). ACR Breast Density Category b: There are scattered areas of fibroglandular density. FINDINGS: Tomosynthesis and synthesized spot-compression CC and MLO views of the area of concern in the RIGHT breast were obtained. Spot compression images confirm a mass or focal asymmetry with vague margins and possible subtle architectural distortion in the INNER breast at MIDDLE to POSTERIOR depth, measuring approximately 1.5 cm. It is most conspicuous on the spot compression CC images. On correlative physical examination, there is no palpable abnormality in the INNER RIGHT breast. Targeted RIGHT breast ultrasound is performed, showing no sonographic correlate for the mammographic mass or asymmetry. Imaging was performed from 1 o'clock through 5 o'clock. IMPRESSION: Suspicious mass or focal asymmetry involving the INNER RIGHT breast at MIDDLE depth  without sonographic correlate. RECOMMENDATION: Stereotactic tomosynthesis core needle biopsy of the focal asymmetry or mass involving the RIGHT breast. The stereotactic tomosynthesis biopsy procedure was discussed with patient and her questions were answered. She has agreed to proceed. She will be contacted by the staff at the [facility] and the biopsy will be scheduled at her convenience. I have discussed the findings and recommendations with the patient. BI-RADS CATEGORY  4: Suspicious. Electronically Signed  By: Evangeline Dakin M.D.   On: 05/14/2019 16:14   Mm Clip Placement Right  Result Date: 05/28/2019 CLINICAL DATA:  Post stereotactic guided biopsy an asymmetry/distortion within the slightly upper inner right breast. EXAM: DIAGNOSTIC RIGHT MAMMOGRAM POST STEREOTACTIC BIOPSY COMPARISON:  Previous exam(s). FINDINGS: Mammographic images were obtained following stereotactic guided biopsy of an asymmetry/distortion in the slightly upper inner right breast. An X shaped biopsy marking clip is present and located approximately 1 cm inferior to the biopsied asymmetry/distortion. IMPRESSION: X shaped biopsy marking clip located approximately 1 cm inferior to the biopsied asymmetry/distortion in the right breast. Final Assessment: Post Procedure Mammograms for Marker Placement Electronically Signed   By: Everlean Alstrom M.D.   On: 05/28/2019 08:42   Mm Rt Breast Bx W Loc Dev 1st Lesion Image Bx Spec Stereo Guide  Addendum Date: 06/01/2019   ADDENDUM REPORT: 06/01/2019 14:25 ADDENDUM: PATHOLOGY revealed: A. RIGHT BREAST, UPPER INNER QUADRANT; STEREOTACTIC-GUIDED CORE BIOPSY: - INVASIVE MAMMARY CARCINOMA, NO SPECIAL TYPE. 5 mm in this sample. Grade 1. Ductal carcinoma in situ: Not identified. Lymphovascular invasion: Not identified. Pathology results are CONCORDANT with imaging findings, per Dr. Everlean Alstrom. Pathology results were discussed with patient via telephone. The patient reported doing  well after the biopsy with tenderness at the site. Post biopsy care instructions were reviewed and questions were answered. The patient was encouraged to call [facility] for any additional concerns. Recommendation: Surgical referral. Request for surgical referral was relayed to nurse navigators at [facility] by Electa Sniff RN on 05/31/2019. Addendum by Electa Sniff RN on 06/01/2019. Electronically Signed   By: Everlean Alstrom M.D.   On: 06/01/2019 14:25   Result Date: 06/01/2019 CLINICAL DATA:  Suspicious focal asymmetry/distortion in the upper inner right breast. EXAM: RIGHT BREAST STEREOTACTIC CORE NEEDLE BIOPSY COMPARISON:  Previous exams. FINDINGS: The patient and I discussed the procedure of stereotactic-guided biopsy including benefits and alternatives. We discussed the high likelihood of a successful procedure. We discussed the risks of the procedure including infection, bleeding, tissue injury, clip migration, and inadequate sampling. Informed written consent was given. The usual time out protocol was performed immediately prior to the procedure. Using sterile technique and 1% Lidocaine as local anesthetic, under stereotactic guidance, a 9 gauge vacuum assisted device was used to perform core needle biopsy of the focal asymmetry/distortion in the upper inner right breast using a superior to inferior approach. Lesion quadrant: Upper inner At the conclusion of the procedure, an X shaped tissue marker clip was deployed into the biopsy cavity. Follow-up 2-view mammogram was performed and dictated separately. IMPRESSION: Stereotactic-guided biopsy of the focal asymmetry/distortion in the upper inner right breast. No apparent complications. Electronically Signed: By: Everlean Alstrom M.D. On: 05/28/2019 08:34    ASSESSMENT & PLAN:   Carcinoma of upper-inner quadrant of right breast in female, estrogen receptor positive ([place]) # Clinical stage I ER PR positive HER-2 negative  breast cancer.   #  I had a long discussion with the patient in general regarding the treatment options of breast cancer including-surgery; adjuvant radiation; role of adjuvant systemic therapy including-chemotherapy antihormone therapy. Patient will need lumpectomy with sentinel lymph node evaluation; followed by radiation.  Patient awaiting surgery plan on October 16.  # Based on preoperative clinical characteristics-it is unlikely the patient will need chemotherapy.  Decision regarding chemotherapy based on final surgical pathology/gene assay. Patient will benefit from antihormone therapy.  I discussed the potential benefits of each option; and also potential downsides in detail.  Also discussed the tumor conference.  #Discussed with the patient the available clinical trial- the Bluestar study (the blood study) to this patient.  This is a noninterventional study.  Patient interested.  Patient will have labs done today.  Discussed with Marlowe Kays.  # Thank you for allowing me to participate in the care of your pleasant patient. Please do not hesitate to contact me with questions or concerns in the interim. Discussed with Dr.Cintron.   # DISPOSITION:  # labs/clinical trial today # follow up 3 weeks- MD; no labs- Dr.B   All questions were answered. The patient/family knows to call the clinic with any problems, questions or concerns.    Cammie Sickle, MD 06/07/2019 12:28 PM

## 2019-06-08 ENCOUNTER — Other Ambulatory Visit: Payer: Self-pay

## 2019-06-08 ENCOUNTER — Encounter
Admission: RE | Admit: 2019-06-08 | Discharge: 2019-06-08 | Disposition: A | Discharge status: Home / Self Care | Payer: 59 | Source: Ambulatory Visit | Attending: General Surgery | Admitting: General Surgery

## 2019-06-08 DIAGNOSIS — Z01818 Encounter for other preprocedural examination: Secondary | ICD-10-CM | POA: Diagnosis present

## 2019-06-08 DIAGNOSIS — Z20828 Contact with and (suspected) exposure to other viral communicable diseases: Secondary | ICD-10-CM | POA: Insufficient documentation

## 2019-06-08 DIAGNOSIS — C50211 Malignant neoplasm of upper-inner quadrant of right female breast: Secondary | ICD-10-CM | POA: Insufficient documentation

## 2019-06-08 HISTORY — DX: Gastro-esophageal reflux disease without esophagitis: K21.9

## 2019-06-08 HISTORY — DX: Headache, unspecified: R51.9

## 2019-06-08 HISTORY — DX: Pneumonia, unspecified organism: J18.9

## 2019-06-08 HISTORY — DX: Dyspnea, unspecified: R06.00

## 2019-06-08 HISTORY — DX: Family history of other specified conditions: Z84.89

## 2019-06-08 HISTORY — DX: Personal history of urinary calculi: Z87.442

## 2019-06-08 LAB — SARS CORONAVIRUS 2 (TAT 6-24 HRS): SARS Coronavirus 2: NEGATIVE

## 2019-06-08 NOTE — Patient Instructions (Signed)
Your procedure is scheduled on: Friday 10/16 Report to Mammography. [facility] at 8:45   Remember: Instructions that are not followed completely may result in serious medical risk,  up to and including death, or upon the discretion of your surgeon and anesthesiologist your  surgery may need to be rescheduled.     _X__ 1. Do not eat food after midnight the night before your procedure.                 No gum chewing or hard candies. You may drink clear liquids up to 2 hours                 before you are scheduled to arrive for your surgery- DO not drink clear                 liquids within 2 hours of the start of your surgery.                 Clear Liquids include:  water, apple juice without pulp, clear carbohydrate                 drink such as Clearfast of Gatorade, Black Coffee or Tea (Do not add                 anything to coffee or tea).  __X__2.  On the morning of surgery brush your teeth with toothpaste and water, you                may rinse your mouth with mouthwash if you wish.  Do not swallow any toothpaste of mouthwash.     ___ 3.  No Alcohol for 24 hours before or after surgery.   ___ 4.  Do Not Smoke or use e-cigarettes For 24 Hours Prior to Your Surgery.                 Do not use any chewable tobacco products for at least 6 hours prior to                 surgery.  ____  5.  Bring all medications with you on the day of surgery if instructed.   _x___  6.  Notify your doctor if there is any change in your medical condition      (cold, fever, infections).     Do not wear jewelry, make-up, hairpins, clips or nail polish. Do not wear lotions, powders, or perfumes. You may wear deodorant. Do not shave 48 hours prior to surgery. Men may shave face and neck. Do not bring valuables to the hospital.    [facility] is not responsible for any belongings or valuables.  Contacts, dentures or bridgework may not be worn into surgery. Leave your  suitcase in the car. After surgery it may be brought to your room. For patients admitted to the hospital, discharge time is determined by your treatment team.   Patients discharged the day of surgery will not be allowed to drive home.   Please read over the following fact sheets that you were given:     __x__ Take these medicines the morning of surgery with A SIP OF WATER:    1. acetaminophen (TYLENOL) 500 MG tablet if needed  2.   3.   4.  5.  6.  ____ Fleet Enema (as directed)   __x__ Use CHG Soap as directed  ____ Use inhalers on the day of surgery  ____ Stop metformin  2 days prior to surgery    ____ Take 1/2 of usual insulin dose the night before surgery. No insulin the morning          of surgery.   __x__ Stopped aspirin on Sunday 10/11  __x__ Stop Anti-inflammatories meloxicam (MOBIC) 15 MG tablet today   ____ Stop supplements until after surgery.    _x___ Bring C-Pap to the hospital.     1 part rubbing alcohol 2 parts water in ziplock  Then freeze to use as ice pack   Colace over counter stool softner 2 times a day when taking pain meds.   Bring bra with no underwire

## 2019-06-11 ENCOUNTER — Ambulatory Visit: Payer: No Typology Code available for payment source | Admitting: Certified Registered Nurse Anesthetist

## 2019-06-11 ENCOUNTER — Ambulatory Visit
Admission: RE | Admit: 2019-06-11 | Discharge: 2019-06-11 | Disposition: A | Discharge status: Home / Self Care | Payer: No Typology Code available for payment source | Attending: General Surgery | Admitting: General Surgery

## 2019-06-11 ENCOUNTER — Other Ambulatory Visit: Payer: Self-pay

## 2019-06-11 ENCOUNTER — Ambulatory Visit
Admission: RE | Admit: 2019-06-11 | Discharge: 2019-06-11 | Disposition: A | Discharge status: Home / Self Care | Payer: No Typology Code available for payment source | Source: Ambulatory Visit | Attending: General Surgery | Admitting: General Surgery

## 2019-06-11 ENCOUNTER — Encounter
Admission: RE | Disposition: A | Discharge status: Home / Self Care | Payer: Self-pay | Source: Home / Self Care | Attending: General Surgery

## 2019-06-11 ENCOUNTER — Encounter: Payer: Self-pay | Admitting: *Deleted

## 2019-06-11 ENCOUNTER — Encounter
Admission: RE | Admit: 2019-06-11 | Discharge: 2019-06-11 | Disposition: A | Discharge status: Home / Self Care | Payer: No Typology Code available for payment source | Source: Ambulatory Visit | Attending: General Surgery | Admitting: General Surgery

## 2019-06-11 DIAGNOSIS — K219 Gastro-esophageal reflux disease without esophagitis: Secondary | ICD-10-CM | POA: Insufficient documentation

## 2019-06-11 DIAGNOSIS — E669 Obesity, unspecified: Secondary | ICD-10-CM | POA: Diagnosis not present

## 2019-06-11 DIAGNOSIS — C50211 Malignant neoplasm of upper-inner quadrant of right female breast: Secondary | ICD-10-CM | POA: Diagnosis not present

## 2019-06-11 DIAGNOSIS — Z8249 Family history of ischemic heart disease and other diseases of the circulatory system: Secondary | ICD-10-CM | POA: Diagnosis not present

## 2019-06-11 DIAGNOSIS — Z791 Long term (current) use of non-steroidal anti-inflammatories (NSAID): Secondary | ICD-10-CM | POA: Insufficient documentation

## 2019-06-11 DIAGNOSIS — I10 Essential (primary) hypertension: Secondary | ICD-10-CM | POA: Diagnosis not present

## 2019-06-11 DIAGNOSIS — Z7982 Long term (current) use of aspirin: Secondary | ICD-10-CM | POA: Insufficient documentation

## 2019-06-11 DIAGNOSIS — Z79899 Other long term (current) drug therapy: Secondary | ICD-10-CM | POA: Insufficient documentation

## 2019-06-11 DIAGNOSIS — Z6841 Body Mass Index (BMI) 40.0 and over, adult: Secondary | ICD-10-CM | POA: Insufficient documentation

## 2019-06-11 HISTORY — PX: PARTIAL MASTECTOMY WITH NEEDLE LOCALIZATION AND AXILLARY SENTINEL LYMPH NODE BX: SHX6009

## 2019-06-11 HISTORY — DX: Malignant neoplasm of unspecified site of unspecified female breast: C50.919

## 2019-06-11 HISTORY — PX: BREAST LUMPECTOMY: SHX2

## 2019-06-11 SURGERY — PARTIAL MASTECTOMY WITH NEEDLE LOCALIZATION AND AXILLARY SENTINEL LYMPH NODE BX
Anesthesia: General | Site: Breast | Laterality: Right

## 2019-06-11 MED ORDER — MIDAZOLAM HCL 2 MG/2ML IJ SOLN
INTRAMUSCULAR | Status: AC
  Filled 2019-06-11: qty 2

## 2019-06-11 MED ORDER — ACETAMINOPHEN 10 MG/ML IV SOLN
INTRAVENOUS | Status: DC | PRN
  Administered 2019-06-11: 1000 mg via INTRAVENOUS

## 2019-06-11 MED ORDER — LIDOCAINE HCL (CARDIAC) PF 100 MG/5ML IV SOSY
PREFILLED_SYRINGE | INTRAVENOUS | Status: DC | PRN
  Administered 2019-06-11: 100 mg via INTRAVENOUS

## 2019-06-11 MED ORDER — SUGAMMADEX SODIUM 200 MG/2ML IV SOLN
INTRAVENOUS | Status: AC
  Filled 2019-06-11: qty 2

## 2019-06-11 MED ORDER — TECHNETIUM TC 99M SULFUR COLLOID FILTERED
1.0000 | Freq: Once | INTRAVENOUS | Status: AC | PRN
  Administered 2019-06-11: 0.833 via INTRADERMAL

## 2019-06-11 MED ORDER — DEXMEDETOMIDINE HCL 200 MCG/2ML IV SOLN
INTRAVENOUS | Status: DC | PRN
  Administered 2019-06-11 (×5): 4 ug via INTRAVENOUS

## 2019-06-11 MED ORDER — ROCURONIUM BROMIDE 50 MG/5ML IV SOLN
INTRAVENOUS | Status: AC
  Filled 2019-06-11: qty 1

## 2019-06-11 MED ORDER — FENTANYL CITRATE (PF) 100 MCG/2ML IJ SOLN
INTRAMUSCULAR | Status: DC | PRN
  Administered 2019-06-11: 25 ug via INTRAVENOUS
  Administered 2019-06-11: 50 ug via INTRAVENOUS
  Administered 2019-06-11 (×2): 25 ug via INTRAVENOUS
  Administered 2019-06-11: 50 ug via INTRAVENOUS
  Administered 2019-06-11: 25 ug via INTRAVENOUS

## 2019-06-11 MED ORDER — EPHEDRINE SULFATE 50 MG/ML IJ SOLN
INTRAMUSCULAR | Status: DC | PRN
  Administered 2019-06-11: 10 mg via INTRAVENOUS
  Administered 2019-06-11: 5 mg via INTRAVENOUS
  Administered 2019-06-11: 10 mg via INTRAVENOUS
  Administered 2019-06-11 (×3): 5 mg via INTRAVENOUS

## 2019-06-11 MED ORDER — FENTANYL CITRATE (PF) 100 MCG/2ML IJ SOLN
25.0000 ug | INTRAMUSCULAR | Status: DC | PRN
  Administered 2019-06-11 (×4): 25 ug via INTRAVENOUS

## 2019-06-11 MED ORDER — LACTATED RINGERS IV SOLN
INTRAVENOUS | Status: DC
  Administered 2019-06-11: 10:00:00 via INTRAVENOUS

## 2019-06-11 MED ORDER — FENTANYL CITRATE (PF) 100 MCG/2ML IJ SOLN
INTRAMUSCULAR | Status: AC
  Filled 2019-06-11: qty 2

## 2019-06-11 MED ORDER — DEXAMETHASONE SODIUM PHOSPHATE 10 MG/ML IJ SOLN
INTRAMUSCULAR | Status: DC | PRN
  Administered 2019-06-11: 10 mg via INTRAVENOUS

## 2019-06-11 MED ORDER — PROPOFOL 10 MG/ML IV BOLUS
INTRAVENOUS | Status: DC | PRN
  Administered 2019-06-11: 200 mg via INTRAVENOUS

## 2019-06-11 MED ORDER — PROPOFOL 10 MG/ML IV BOLUS
INTRAVENOUS | Status: AC
  Filled 2019-06-11: qty 20

## 2019-06-11 MED ORDER — SUCCINYLCHOLINE CHLORIDE 20 MG/ML IJ SOLN
INTRAMUSCULAR | Status: AC
  Filled 2019-06-11: qty 1

## 2019-06-11 MED ORDER — CEFAZOLIN SODIUM-DEXTROSE 2-4 GM/100ML-% IV SOLN
2.0000 g | INTRAVENOUS | Status: AC
  Administered 2019-06-11: 11:00:00 2 g via INTRAVENOUS

## 2019-06-11 MED ORDER — FAMOTIDINE 20 MG PO TABS
20.0000 mg | ORAL_TABLET | Freq: Once | ORAL | Status: AC
  Administered 2019-06-11: 11:00:00 20 mg via ORAL

## 2019-06-11 MED ORDER — DEXAMETHASONE SODIUM PHOSPHATE 10 MG/ML IJ SOLN
INTRAMUSCULAR | Status: AC
  Filled 2019-06-11: qty 1

## 2019-06-11 MED ORDER — ACETAMINOPHEN 10 MG/ML IV SOLN
INTRAVENOUS | Status: AC
  Filled 2019-06-11: qty 100

## 2019-06-11 MED ORDER — LIDOCAINE HCL (PF) 2 % IJ SOLN
INTRAMUSCULAR | Status: AC
  Filled 2019-06-11: qty 10

## 2019-06-11 MED ORDER — ONDANSETRON HCL 4 MG/2ML IJ SOLN
INTRAMUSCULAR | Status: DC | PRN
  Administered 2019-06-11: 4 mg via INTRAVENOUS

## 2019-06-11 MED ORDER — FENTANYL CITRATE (PF) 100 MCG/2ML IJ SOLN
INTRAMUSCULAR | Status: AC
  Administered 2019-06-11: 25 ug via INTRAVENOUS
  Filled 2019-06-11: qty 2

## 2019-06-11 MED ORDER — SUGAMMADEX SODIUM 200 MG/2ML IV SOLN
INTRAVENOUS | Status: DC | PRN
  Administered 2019-06-11: 217.2 mg via INTRAVENOUS

## 2019-06-11 MED ORDER — HYDROCODONE-ACETAMINOPHEN 5-325 MG PO TABS
1.0000 | ORAL_TABLET | ORAL | Status: DC | PRN
  Administered 2019-06-11: 1 via ORAL

## 2019-06-11 MED ORDER — PHENYLEPHRINE HCL (PRESSORS) 10 MG/ML IV SOLN
INTRAVENOUS | Status: DC | PRN
  Administered 2019-06-11 (×2): 100 ug via INTRAVENOUS
  Administered 2019-06-11 (×3): 50 ug via INTRAVENOUS
  Administered 2019-06-11 (×2): 100 ug via INTRAVENOUS
  Administered 2019-06-11: 50 ug via INTRAVENOUS

## 2019-06-11 MED ORDER — ONDANSETRON HCL 4 MG/2ML IJ SOLN
INTRAMUSCULAR | Status: AC
  Filled 2019-06-11: qty 2

## 2019-06-11 MED ORDER — BUPIVACAINE-EPINEPHRINE (PF) 0.5% -1:200000 IJ SOLN
INTRAMUSCULAR | Status: DC | PRN
  Administered 2019-06-11: 30 mL

## 2019-06-11 MED ORDER — HYDROCODONE-ACETAMINOPHEN 5-325 MG PO TABS
ORAL_TABLET | ORAL | Status: AC
  Filled 2019-06-11: qty 1

## 2019-06-11 MED ORDER — DEXMEDETOMIDINE HCL IN NACL 80 MCG/20ML IV SOLN
INTRAVENOUS | Status: AC
  Filled 2019-06-11: qty 20

## 2019-06-11 MED ORDER — FAMOTIDINE 20 MG PO TABS
ORAL_TABLET | ORAL | Status: AC
  Administered 2019-06-11: 20 mg via ORAL
  Filled 2019-06-11: qty 1

## 2019-06-11 MED ORDER — CEFAZOLIN SODIUM-DEXTROSE 2-4 GM/100ML-% IV SOLN
INTRAVENOUS | Status: AC
  Filled 2019-06-11: qty 100

## 2019-06-11 MED ORDER — MIDAZOLAM HCL 2 MG/2ML IJ SOLN
INTRAMUSCULAR | Status: DC | PRN
  Administered 2019-06-11: 2 mg via INTRAVENOUS

## 2019-06-11 MED ORDER — HYDROCODONE-ACETAMINOPHEN 5-325 MG PO TABS: 1.0000 | ORAL_TABLET | ORAL | 0 refills | Status: AC | PRN

## 2019-06-11 MED ORDER — EPHEDRINE SULFATE 50 MG/ML IJ SOLN
INTRAMUSCULAR | Status: AC
  Filled 2019-06-11: qty 1

## 2019-06-11 MED ORDER — ROCURONIUM BROMIDE 100 MG/10ML IV SOLN
INTRAVENOUS | Status: DC | PRN
  Administered 2019-06-11: 20 mg via INTRAVENOUS
  Administered 2019-06-11: 50 mg via INTRAVENOUS

## 2019-06-11 MED ORDER — ONDANSETRON HCL 4 MG/2ML IJ SOLN: 4.0000 mg | Freq: Once | INTRAMUSCULAR | Status: DC | PRN

## 2019-06-11 SURGICAL SUPPLY — 51 items
BINDER BREAST LRG (GAUZE/BANDAGES/DRESSINGS) IMPLANT
BINDER BREAST MEDIUM (GAUZE/BANDAGES/DRESSINGS) IMPLANT
BINDER BREAST XLRG (GAUZE/BANDAGES/DRESSINGS) IMPLANT
BINDER BREAST XXLRG (GAUZE/BANDAGES/DRESSINGS) IMPLANT
BLADE SURG 15 STRL LF DISP TIS (BLADE) ×2 IMPLANT
BLADE SURG 15 STRL SS (BLADE) ×4
CANISTER SUCT 1200ML W/VALVE (MISCELLANEOUS) ×3 IMPLANT
CHLORAPREP W/TINT 26 (MISCELLANEOUS) ×3 IMPLANT
CLOSURE WOUND 1/2 X4 (GAUZE/BANDAGES/DRESSINGS) ×1
CNTNR SPEC 2.5X3XGRAD LEK (MISCELLANEOUS)
CONT SPEC 4OZ STER OR WHT (MISCELLANEOUS)
CONTAINER SPEC 2.5X3XGRAD LEK (MISCELLANEOUS) IMPLANT
COVER WAND RF STERILE (DRAPES) ×3 IMPLANT
DERMABOND ADVANCED (GAUZE/BANDAGES/DRESSINGS) ×2
DERMABOND ADVANCED .7 DNX12 (GAUZE/BANDAGES/DRESSINGS) ×1 IMPLANT
DEVICE DUBIN SPECIMEN MAMMOGRA (MISCELLANEOUS) ×3 IMPLANT
DRAPE LAPAROTOMY TRNSV 106X77 (MISCELLANEOUS) ×3 IMPLANT
DRSG GAUZE FLUFF 36X18 (GAUZE/BANDAGES/DRESSINGS) IMPLANT
ELECT CAUTERY BLADE 6.4 (BLADE) ×3 IMPLANT
ELECT REM PT RETURN 9FT ADLT (ELECTROSURGICAL) ×3
ELECTRODE REM PT RTRN 9FT ADLT (ELECTROSURGICAL) ×1 IMPLANT
GAUZE 4X4 16PLY RFD (DISPOSABLE) ×3 IMPLANT
GLOVE BIO SURGEON STRL SZ 6.5 (GLOVE) ×4 IMPLANT
GLOVE BIO SURGEONS STRL SZ 6.5 (GLOVE) ×2
GLOVE BIOGEL PI IND STRL 6.5 (GLOVE) ×1 IMPLANT
GLOVE BIOGEL PI INDICATOR 6.5 (GLOVE) ×2
GOWN STRL REUS W/ TWL LRG LVL3 (GOWN DISPOSABLE) ×2 IMPLANT
GOWN STRL REUS W/TWL LRG LVL3 (GOWN DISPOSABLE) ×4
KIT TURNOVER KIT A (KITS) ×3 IMPLANT
LABEL OR SOLS (LABEL) ×3 IMPLANT
MARGIN MAP 10MM (MISCELLANEOUS) ×3 IMPLANT
MARKER SKIN DUAL TIP RULER LAB (MISCELLANEOUS) ×3 IMPLANT
NEEDLE HYPO 22GX1.5 SAFETY (NEEDLE) ×3 IMPLANT
NEEDLE HYPO 25X1 1.5 SAFETY (NEEDLE) ×3 IMPLANT
PACK BASIN MINOR ARMC (MISCELLANEOUS) ×3 IMPLANT
RETRACTOR RING XSMALL (MISCELLANEOUS) ×1 IMPLANT
RTRCTR WOUND ALEXIS 13CM XS SH (MISCELLANEOUS) ×3
SLEVE PROBE SENORX GAMMA FIND (MISCELLANEOUS) ×3 IMPLANT
STRIP CLOSURE SKIN 1/2X4 (GAUZE/BANDAGES/DRESSINGS) ×2 IMPLANT
SUT ETHILON 3-0 FS-10 30 BLK (SUTURE) ×3
SUT MNCRL 4-0 (SUTURE) ×4
SUT MNCRL 4-0 27XMFL (SUTURE) ×2
SUT PROLENE 3 0 FS 2 (SUTURE) ×6 IMPLANT
SUT SILK 2 0 SH (SUTURE) ×6 IMPLANT
SUT VIC AB 3-0 SH 27 (SUTURE) ×8
SUT VIC AB 3-0 SH 27X BRD (SUTURE) ×4 IMPLANT
SUTURE EHLN 3-0 FS-10 30 BLK (SUTURE) ×1 IMPLANT
SUTURE MNCRL 4-0 27XMF (SUTURE) ×2 IMPLANT
SYR 10ML LL (SYRINGE) ×6 IMPLANT
SYR BULB IRRIG 60ML STRL (SYRINGE) ×3 IMPLANT
WATER STERILE IRR 1000ML POUR (IV SOLUTION) ×3 IMPLANT

## 2019-06-11 NOTE — Anesthesia Postprocedure Evaluation (Signed)
Anesthesia Post Note  Patient: Lisa Jennings  Procedure(s) Performed: PARTIAL MASTECTOMY WITH NEEDLE LOCALIZATION AND AXILLARY SENTINEL LYMPH NODE BX (Right Breast)  Patient location during evaluation: PACU Anesthesia Type: General Level of consciousness: awake and alert Pain management: pain level controlled Vital Signs Assessment: post-procedure vital signs reviewed and stable Respiratory status: spontaneous breathing and respiratory function stable Cardiovascular status: stable Anesthetic complications: no     Last Vitals:  Vitals:   06/11/19 1017 06/11/19 1327  BP: 117/73 (!) 127/58  Pulse: 69 100  Resp: 16 13  Temp: 36.9 C 36.6 C  SpO2: 99% 99%    Last Pain:  Vitals:   06/11/19 1327  PainSc: 0-No pain                 Satrina Magallanes K

## 2019-06-11 NOTE — Anesthesia Post-op Follow-up Note (Signed)
Anesthesia QCDR form completed.        

## 2019-06-11 NOTE — Transfer of Care (Signed)
Immediate Anesthesia Transfer of Care Note  Patient: Lisa Jennings  Procedure(s) Performed: PARTIAL MASTECTOMY WITH NEEDLE LOCALIZATION AND AXILLARY SENTINEL LYMPH NODE BX (Right Breast)  Patient Location: PACU  Anesthesia Type:General  Level of Consciousness: sedated  Airway & Oxygen Therapy: Patient Spontanous Breathing and Patient connected to face mask oxygen  Post-op Assessment: Report given to RN and Post -op Vital signs reviewed and stable  Post vital signs: Reviewed and stable  Last Vitals:  Vitals Value Taken Time  BP 127/58 06/11/19 1328  Temp    Pulse 87 06/11/19 1330  Resp 15 06/11/19 1330  SpO2 98 % 06/11/19 1330  Vitals shown include unvalidated device data.  Last Pain:  Vitals:   06/11/19 1327  PainSc: (P) 0-No pain         Complications: No apparent anesthesia complications

## 2019-06-11 NOTE — Anesthesia Procedure Notes (Signed)
Procedure Name: Intubation Date/Time: 06/11/2019 10:56 AM Performed by: Caryl Asp, CRNA Pre-anesthesia Checklist: Patient identified, Patient being monitored, Timeout performed, Emergency Drugs available and Suction available Patient Re-evaluated:Patient Re-evaluated prior to induction Oxygen Delivery Method: Circle system utilized Preoxygenation: Pre-oxygenation with 100% oxygen Induction Type: IV induction Ventilation: Mask ventilation without difficulty Laryngoscope Size: 3 and McGraph Grade View: Grade I Tube type: Oral Tube size: 7.0 mm Number of attempts: 1 Airway Equipment and Method: Video-laryngoscopy Placement Confirmation: ETT inserted through vocal cords under direct vision,  positive ETCO2 and breath sounds checked- equal and bilateral Secured at: 21 cm Tube secured with: Tape Dental Injury: Teeth and Oropharynx as per pre-operative assessment  Comments: McGraph per provider preference

## 2019-06-11 NOTE — Op Note (Signed)
Preoperative diagnosis: Right breast carcinoma.  Postoperative diagnosis: Right breast carcinoma.   Procedure: Right needle-localized partial mastectomy.                       Right Axillary Sentinel Lymph node biopsy  Anesthesia: GETA  Surgeon: Dr. Windell Moment  Wound Classification: Clean  Indications: Patient is a 58 y.o. female with a nonpalpable right breast mass noted on mammography with core biopsy demonstrating  requires needle-localized partial mastectomy for treatment with sentinel lymph node biopsy.   Findings: 1. Specimen mammography shows marker and wire on specimen 2. Pathology call refers gross examination of margins was ink extended to posterior margin and close to inferior margin 3. No other palpable mass or lymph node identified.   Description of procedure: Preoperative needle localization was performed by radiology. In the nuclear medicine suite, the subareolar region was injected with Tc-99 sulfur colloid. Localization studies were reviewed. The patient was taken to the operating room and placed supine on the operating table, and after general anesthesia the right chest and axilla were prepped and draped in the usual sterile fashion. A time-out was completed verifying correct patient, procedure, site, positioning, and implant(s) and/or special equipment prior to beginning this procedure.  By comparing the localization studies with the direction and skin entry site of the needle, the probable trajectory and location of the mass was visualized. A circumareolar skin incision was planned in such a way as to minimize the amount of dissection to reach the mass.  The skin incision was made. Flaps were raised and the location of the wire confirmed. The wire was delivered into the wound. A 2-0 silk figure-of-eight stay suture was placed around the wire and used for retraction. Dissection was then taken down circumferentially, taking care to include the entire localizing needle and a  wide margin of grossly normal tissue.  The specimen and entire localizing wire were removed. The specimen was oriented and sent to radiology with the localization studies.Intra-Op pathology consult report that the mass extending to the posterior margin and it was a 5 mm from the inferior margin.  Reexcision of the posterior margin which was the pectoralis fascia included muscle was done.  Also reexcision of the inferior and lateral margin were done.  All of them were oriented and sent to pathology.  The wound was irrigated. Hemostasis was checked. The wound was closed with interrupted sutures of 3-0 Vicryl and a subcuticular suture of Monocryl 4-0. No attempt was made to close the dead space. A dressing was applied.  A hand-held gamma probe was used to identify the location of the hottest spot in the axilla. An incision was made around the caudal axillary hairline. Dissection was carried down until subdermal facias was advanced. The probe was placed and again, the point of maximal count was found. Dissection continue until nodule was identified. The node was excised in its entirety. Ex vivo, the node measured 1433 counts when placed on the probe. The bed of the node measured 52 counts. No additional hot spots were identified. No clinically abnormal nodes were palpated. The procedure was terminated. Hemostasis was achieved and the wound closed in layers with deep interrupted 3-0 Vicryl and skin was closed with subcuticular suture of Monocryl 3-0.  The patient tolerated the procedure well and was taken to the postanesthesia care unit in stable condition.   Specimen: Right Breast mass (Orientation markers used: Cranial, Caudal, Medial, Lateral,Skin, Deep)  Sentinel Lymph node                   Re excision of margins: 1. Deep, 2. Inferior, 3. Lateral  Complications: None  Estimated Blood Loss: 30 mL

## 2019-06-11 NOTE — Anesthesia Preprocedure Evaluation (Signed)
Anesthesia Evaluation  Patient identified by MRN, date of birth, ID band Patient awake    Reviewed: Allergy & Precautions, NPO status , Patient's Chart, lab work & pertinent test results  History of Anesthesia Complications (+) Family history of anesthesia reactionNegative for: history of anesthetic complications  Airway Mallampati: III       Dental   Pulmonary neg sleep apnea, neg COPD, Not current smoker,           Cardiovascular hypertension, Pt. on medications (-) Past MI and (-) CHF (-) dysrhythmias (-) Valvular Problems/Murmurs     Neuro/Psych neg Seizures    GI/Hepatic Neg liver ROS, GERD  Medicated and Controlled,  Endo/Other  neg diabetes  Renal/GU negative Renal ROS     Musculoskeletal   Abdominal   Peds  Hematology   Anesthesia Other Findings   Reproductive/Obstetrics                             Anesthesia Physical Anesthesia Plan  ASA: III  Anesthesia Plan: General   Post-op Pain Management:    Induction: Intravenous  PONV Risk Score and Plan: 3 and Dexamethasone, Ondansetron and Midazolam  Airway Management Planned: LMA  Additional Equipment:   Intra-op Plan:   Post-operative Plan:   Informed Consent: I have reviewed the patients History and Physical, chart, labs and discussed the procedure including the risks, benefits and alternatives for the proposed anesthesia with the patient or authorized representative who has indicated his/her understanding and acceptance.       Plan Discussed with:   Anesthesia Plan Comments:         Anesthesia Quick Evaluation

## 2019-06-11 NOTE — Discharge Instructions (Signed)
  Diet: Resume home heart healthy regular diet.   Activity: No heavy lifting >20 pounds (children, pets, laundry, garbage) or strenuous activity until follow-up, but light activity and walking are encouraged. Do not drive or drink alcohol if taking narcotic pain medications.  Wound care: May shower with soapy water and pat dry (do not rub incisions), but no baths or submerging incision underwater until follow-up. (no swimming)   Medications: Resume all home medications. For mild to moderate pain: acetaminophen (Tylenol) or ibuprofen (if no kidney disease). Combining Tylenol with alcohol can substantially increase your risk of causing liver disease. Narcotic pain medications, if prescribed, can be used for severe pain, though may cause nausea, constipation, and drowsiness. Do not combine Tylenol and Norco within a 6 hour period as Norco contains Tylenol. If you do not need the narcotic pain medication, you do not need to fill the prescription.  Call office ([phone]) at any time if any questions, worsening pain, fevers/chills, bleeding, drainage from incision site, or other concerns.   AMBULATORY SURGERY  DISCHARGE INSTRUCTIONS   1) The drugs that you were given will stay in your system until tomorrow so for the next 24 hours you should not:  A) Drive an automobile B) Make any legal decisions C) Drink any alcoholic beverage   2) You may resume regular meals tomorrow.  Today it is better to start with liquids and gradually work up to solid foods.  You may eat anything you prefer, but it is better to start with liquids, then soup and crackers, and gradually work up to solid foods.   3) Please notify your doctor immediately if you have any unusual bleeding, trouble breathing, redness and pain at the surgery site, drainage, fever, or pain not relieved by medication.    4) Additional Instructions:        Please contact your physician with any problems or Same Day Surgery at  [phone], Monday through Friday 6 am to 4 pm, or [place] at [place] Main number at [phone]. 

## 2019-06-11 NOTE — Interval H&P Note (Signed)
History and Physical Interval Note:  06/11/2019 10:35 AM  Lisa Jennings  has presented today for surgery, with the diagnosis of C50.211 malignant neioplasm of upper-inner quadrant of Rt female breast, unspecified estrogen receptorpart.  The various methods of treatment have been discussed with the patient and family. After consideration of risks, benefits and other options for treatment, the patient has consented to  Procedure(s): PARTIAL MASTECTOMY WITH NEEDLE LOCALIZATION AND AXILLARY SENTINEL LYMPH NODE BX (Right) as a surgical intervention.  The patient's history has been reviewed, patient examined, no change in status, stable for surgery.  I have reviewed the patient's chart and labs. Right breast marked in the pre procedure area.  Questions were answered to the patient's satisfaction.     Herbert Pun

## 2019-06-12 ENCOUNTER — Encounter: Payer: Self-pay | Admitting: General Surgery

## 2019-06-16 LAB — SURGICAL PATHOLOGY

## 2019-06-28 ENCOUNTER — Inpatient Hospital Stay: Payer: 59 | Attending: Internal Medicine | Admitting: Internal Medicine

## 2019-06-28 ENCOUNTER — Other Ambulatory Visit: Payer: Self-pay

## 2019-06-28 ENCOUNTER — Encounter: Payer: Self-pay | Admitting: Internal Medicine

## 2019-06-28 DIAGNOSIS — C50211 Malignant neoplasm of upper-inner quadrant of right female breast: Secondary | ICD-10-CM | POA: Diagnosis not present

## 2019-06-28 DIAGNOSIS — Z170 Estrogen receptor positive status [ER+]: Secondary | ICD-10-CM

## 2019-06-28 NOTE — Assessment & Plan Note (Addendum)
#  Stage I ER PR positive HER-2 negative breast cancer s/p Lumpectomy & SLNBx.  Given G-1/ no LVI; would not recommend Oncotype testing.  Reviewed the pathology/staging in detail.  Overall good prognosis discussed.  # I would recommend proceeding with postlumpectomy radiation; will make a referral to radiation oncology.  Discussed the potential radiation side effects/radiation schedule.   #With regards to systemic therapy-I would not recommend chemotherapy/or checking for Oncotype-for reasons above.  Benefit from chemotherapy will be extremely low based on clinical characteristics.  Recommend proceeding with antihormone therapy/after radiation.   #Discussed the mechanism of action of aromatase inhibitors-with blocking of estrogen to prevent breast cancer.  Also discussed the potential side effects including but not limited to arthralgias hot flashes. Would give script for femara at next visit. Will order BMD at next visit.   #Right breast post lumpectomy serous drainage-expected post surgery; if worse recommend follow-up with surgery.  #Reviewed the above plan of care with the patient/and the daughter Lexine Baton at length.  They are in agreement.  # DISPOSITION:  # referral to Dr.Crystal re: breast cancer # follow up 6 weeks- MD; no labs- Dr.B

## 2019-06-28 NOTE — Progress Notes (Signed)
I connected with Lisa Jennings on 06/28/19 at 10:45 AM EST by video enabled telemedicine visit and verified that I am speaking with the correct person using two identifiers.  I discussed the limitations, risks, security and privacy concerns of performing an evaluation and management service by telemedicine and the availability of in-person appointments. I also discussed with the patient that there may be a patient responsible charge related to this service. The patient expressed understanding and agreed to proceed.    Other persons participating in the visit and their role in the encounter: RN/medical reconciliation Patient's location: Home Provider's location: Home  Oncology History Overview Note  # OCT 2020-RIGHT BREAST, UPPER INNER QUADRANT;  INVASIVE MAMMARY CARCINOMA, NO SPECIAL TYPE. S/p Lumpectomy & SLNBx [Dr.Cintron]pT1b pN0; G-1; ER/PR >90%; her 2neu-NEG;   DIAGNOSIS: Right breast cancer  STAGE:    1     ;  GOALS: Cure  CURRENT/MOST RECENT THERAPY : awaiting RT.     Carcinoma of upper-inner quadrant of right breast in female, estrogen receptor positive ([place])  06/07/2019 Initial Diagnosis   Carcinoma of upper-inner quadrant of right breast in female, estrogen receptor positive ([place])    Chief Complaint: Breast cancer  History of present illness:Lisa Jennings 58 y.o.  female with history of breast cancer ER/PR positive HER-2 negative s/p surgery is here for follow-up.  Patient is recovering fairly well from surgery.  Patient complains of serous drainage from the postlumpectomy drainage site.  Otherwise no erythema.  No fevers or chills.  No significant pain.  Patient states that she was recently evaluated by surgery with regards to the drainage.  She notes the drainage is improved since evaluation with surgery.  Observation/objective: Pathology as discussed below  Assessment and plan: Carcinoma of upper-inner quadrant of right breast in female, estrogen  receptor positive ([place]) #  Stage I ER PR positive HER-2 negative breast cancer s/p Lumpectomy & SLNBx.  Given G-1/ no LVI; would not recommend Oncotype testing.  Reviewed the pathology/staging in detail.  Overall good prognosis discussed.  # I would recommend proceeding with postlumpectomy radiation; will make a referral to radiation oncology.  Discussed the potential radiation side effects/radiation schedule.   #With regards to systemic therapy-I would not recommend chemotherapy/or checking for Oncotype-for reasons above.  Benefit from chemotherapy will be extremely low based on clinical characteristics.  Recommend proceeding with antihormone therapy/after radiation.   #Discussed the mechanism of action of aromatase inhibitors-with blocking of estrogen to prevent breast cancer.  Also discussed the potential side effects including but not limited to arthralgias hot flashes. Would give script for femara at next visit. Will order BMD at next visit.   #Right breast post lumpectomy serous drainage-expected post surgery; if worse recommend follow-up with surgery.  #Reviewed the above plan of care with the patient/and the daughter Lisa Jennings at length.  They are in agreement.  # DISPOSITION:  # referral to Dr.Crystal re: breast cancer # follow up 6 weeks- MD; no labs- Dr.B   Follow-up instructions:  I discussed the assessment and treatment plan with the patient.  The patient was provided an opportunity to ask questions and all were answered.  The patient agreed with the plan and demonstrated understanding of instructions.  The patient was advised to call back or seek an in person evaluation if the symptoms worsen or if the condition fails to improve as anticipated.  Dr. Charlaine Dalton [place] at [facility] 06/28/2019 11:24 AM

## 2019-07-02 ENCOUNTER — Other Ambulatory Visit: Payer: Self-pay

## 2019-07-05 ENCOUNTER — Ambulatory Visit
Admission: RE | Admit: 2019-07-05 | Discharge: 2019-07-05 | Disposition: A | Discharge status: Home / Self Care | Payer: 59 | Source: Ambulatory Visit | Attending: Radiation Oncology | Admitting: Radiation Oncology

## 2019-07-05 ENCOUNTER — Encounter: Payer: Self-pay | Admitting: Radiation Oncology

## 2019-07-05 ENCOUNTER — Other Ambulatory Visit: Payer: Self-pay

## 2019-07-05 VITALS — BP 139/82 | HR 71 | Temp 98.0°F | Resp 16 | Wt 240.1 lb

## 2019-07-05 DIAGNOSIS — Z79899 Other long term (current) drug therapy: Secondary | ICD-10-CM | POA: Insufficient documentation

## 2019-07-05 DIAGNOSIS — K219 Gastro-esophageal reflux disease without esophagitis: Secondary | ICD-10-CM | POA: Diagnosis not present

## 2019-07-05 DIAGNOSIS — I10 Essential (primary) hypertension: Secondary | ICD-10-CM | POA: Diagnosis not present

## 2019-07-05 DIAGNOSIS — C50211 Malignant neoplasm of upper-inner quadrant of right female breast: Secondary | ICD-10-CM | POA: Diagnosis not present

## 2019-07-05 DIAGNOSIS — Z7982 Long term (current) use of aspirin: Secondary | ICD-10-CM | POA: Diagnosis not present

## 2019-07-05 DIAGNOSIS — Z170 Estrogen receptor positive status [ER+]: Secondary | ICD-10-CM | POA: Insufficient documentation

## 2019-07-05 NOTE — Consult Note (Signed)
NEW PATIENT EVALUATION  Name: Lisa Jennings  MRN: [id number]  Date:   07/05/2019     DOB: 02-23-1961   This 58 y.o. female patient presents to the clinic for initial evaluation of stage I (T1b N0 M0) ER/PR positive HER-2 negative invasive mammary carcinoma of the right breast status post wide local excision and sentinel node biopsy.  REFERRING PHYSICIAN: Hortencia Pilar, MD  CHIEF COMPLAINT:  Chief Complaint  Patient presents with  . Breast Cancer    Initial consultation    DIAGNOSIS: The encounter diagnosis was Carcinoma of upper-inner quadrant of right breast in female, estrogen receptor positive ([place]).   PREVIOUS INVESTIGATIONS:  Mammogram and ultrasound reviewed Pathology report reviewed Clinical notes reviewed  HPI: Patient is a 58 year old female who presented with an abnormal mammogram of her right breast showing focal asymmetry or suspicious mass in the inner right breast at middle depth with sonographic correlation.  She underwent stereotactic tomosynthesis core biopsy showing invasive mammary carcinoma.  She went on to have a wide local excision and sentinel node biopsy showing a 1 x 0.8 x 0.8 cm invasive mammary carcinoma overall grade 1 with margins clear after reexcision with closest margin at 8 mm.  She had 2 sentinel lymph nodes examined both negative for metastatic disease.  Again tumor was strongly ER/PR positive.  She has had some slow healing with now some radiate granulation tissue in the wound site which is somewhat uncomfortable and comfortable although she is tolerating her healing well.  She specifically [place] cough or bone pain.  She has been seen by medical oncology.  Based on the grade 1 disease no lymphovascular invasion Oncotype DX was not recommended and systemic chemotherapy also was not recommended.  She is seen today for radiation oncology consultation.  Still having healing problems with her wound as described above.  PLANNED TREATMENT REGIMEN:  Right whole breast radiation  PAST MEDICAL HISTORY:  has a past medical history of Breast cancer ([place]), Carcinoma of upper-inner quadrant of right breast in female, estrogen receptor positive ([place]) (06/07/2019), Dyspnea, Family history of adverse reaction to anesthesia, GERD (gastroesophageal reflux disease), Headache, History of kidney stones, Hypertension, and Pneumonia.    PAST SURGICAL HISTORY:  Past Surgical History:  Procedure Laterality Date  . APPENDECTOMY    . BREAST BIOPSY Right 2016   neg/ bx -clip  . BREAST BIOPSY Right 05/28/2019   x clip, stereo bx, invasive mammary carcinoma   . BREAST LUMPECTOMY Right 06/11/2019  . LITHOTRIPSY    . PARTIAL MASTECTOMY WITH NEEDLE LOCALIZATION AND AXILLARY SENTINEL LYMPH NODE BX Right 06/11/2019   Procedure: PARTIAL MASTECTOMY WITH NEEDLE LOCALIZATION AND AXILLARY SENTINEL LYMPH NODE BX;  Surgeon: Herbert Pun, MD;  Location: [facility] ORS;  Service: General;  Laterality: Right;  . TUBAL LIGATION    . WRIST FRACTURE SURGERY      FAMILY HISTORY: family history includes Breast cancer (age of onset: 65) in her maternal aunt; Diabetes in her mother.  SOCIAL HISTORY:  reports that she has never smoked. She has never used smokeless tobacco. She reports that she does not drink alcohol or use drugs.  ALLERGIES: Patient has no known allergies.  MEDICATIONS:  Current Outpatient Medications  Medication Sig Dispense Refill  . acetaminophen (TYLENOL) 500 MG tablet Take 1,000 mg by mouth every 6 (six) hours as needed for moderate pain.    Marland Kitchen aspirin EC 81 MG tablet Take 81 mg by mouth daily.    . Calcium Carb-Cholecalciferol (CALCIUM 600+D) 600-800 MG-UNIT TABS  Take 1 tablet by mouth daily.    . meloxicam (MOBIC) 15 MG tablet Take 15 mg by mouth daily.    . Multiple Vitamins-Minerals (CENTRUM SILVER ADULT 50+ PO) Take 1 tablet by mouth daily.    . valsartan-hydrochlorothiazide (DIOVAN-HCT) 160-12.5 MG tablet Take 1 tablet by mouth daily.     No  current facility-administered medications for this encounter.     ECOG PERFORMANCE STATUS:  1 - Symptomatic but completely ambulatory  REVIEW OF SYSTEMS: Patient denies any weight loss, fatigue, weakness, fever, chills or night sweats. Patient denies any loss of vision, blurred vision. Patient denies any ringing  of the ears or hearing loss. No irregular heartbeat. Patient denies heart murmur or history of fainting. Patient denies any chest pain or pain radiating to her upper extremities. Patient denies any shortness of breath, difficulty breathing at night, cough or hemoptysis. Patient denies any swelling in the lower legs. Patient denies any nausea vomiting, vomiting of blood, or coffee ground material in the vomitus. Patient denies any stomach pain. Patient states has had normal bowel movements no significant constipation or diarrhea. Patient denies any dysuria, hematuria or significant nocturia. Patient denies any problems walking, swelling in the joints or loss of balance. Patient denies any skin changes, loss of hair or loss of weight. Patient denies any excessive worrying or anxiety or significant depression. Patient denies any problems with insomnia. Patient denies excessive thirst, polyuria, polydipsia. Patient denies any swollen glands, patient denies easy bruising or easy bleeding. Patient denies any recent infections, allergies or URI. Patient "s visual fields have not changed significantly in recent time.   PHYSICAL EXAM: BP 139/82 (BP Location: Left Arm, Patient Position: Sitting)   Pulse 71   Temp 98 F (36.7 C) (Tympanic)   Resp 16   Wt 240 lb 1.6 oz (108.9 kg)   BMI 40.58 kg/m  Patient has a large pendulous breasts.  She has a lumpectomy incision which has some granulation tissue in it is still healing slowly in the right lower inner quadrant.  No other dominant mass or nodularity is noted in either breast in 2 positions examined.  No axillary or supraclavicular adenopathy is  identified.  Well-developed well-nourished patient in NAD. HEENT reveals PERLA, EOMI, discs not visualized.  Oral cavity is clear. No oral mucosal lesions are identified. Neck is clear without evidence of cervical or supraclavicular adenopathy. Lungs are clear to A&P. Cardiac examination is essentially unremarkable with regular rate and rhythm without murmur rub or thrill. Abdomen is benign with no organomegaly or masses noted. Motor sensory and DTR levels are equal and symmetric in the upper and lower extremities. Cranial nerves II through XII are grossly intact. Proprioception is intact. No peripheral adenopathy or edema is identified. No motor or sensory levels are noted. Crude visual fields are within normal range.  LABORATORY DATA: Pathology report reviewed and compatible with above-stated findings    RADIOLOGY RESULTS: Mammogram and ultrasound reviewed and compatible with above-stated findings   IMPRESSION: Stage I ER/PR positive grade 1 invasive mammary carcinoma of the right breast status post wide local excision and sentinel node biopsy in 59 year old female with extremely large and pendulous breasts.  PLAN: This time based on the size of her breast I do not believe hypofractionated course of treatment would be advisable.  I have recommended 5040 cGy in 28 fractions boosting her scar another 1400 cGy using electron-beam.  Risks and benefits of treatment including skin reaction fatigue possible skin breakdown based on the large size  of her breasts possible inclusion of superficial lung possible alteration of blood counts all were described in detail to the patient.  She seems to comprehend my treatment plan well.  I have personally set up and ordered CT simulation about week and a half's time to allow some further healing of her breast.  Patient comprehends my treatment plan well.  Patient would benefit from antiestrogen therapy after completion of radiation.  I would like to take this  opportunity to thank you for allowing me to participate in the care of your patient.Noreene Filbert, MD

## 2019-07-13 ENCOUNTER — Other Ambulatory Visit: Payer: Self-pay

## 2019-07-14 ENCOUNTER — Other Ambulatory Visit: Payer: Self-pay

## 2019-07-14 ENCOUNTER — Ambulatory Visit
Admission: RE | Admit: 2019-07-14 | Discharge: 2019-07-14 | Disposition: A | Discharge status: Home / Self Care | Payer: No Typology Code available for payment source | Source: Ambulatory Visit | Attending: Radiation Oncology | Admitting: Radiation Oncology

## 2019-07-14 DIAGNOSIS — C50211 Malignant neoplasm of upper-inner quadrant of right female breast: Secondary | ICD-10-CM | POA: Insufficient documentation

## 2019-07-15 DIAGNOSIS — C50211 Malignant neoplasm of upper-inner quadrant of right female breast: Secondary | ICD-10-CM | POA: Diagnosis not present

## 2019-07-20 ENCOUNTER — Other Ambulatory Visit: Payer: Self-pay | Admitting: *Deleted

## 2019-07-20 DIAGNOSIS — Z170 Estrogen receptor positive status [ER+]: Secondary | ICD-10-CM

## 2019-07-20 DIAGNOSIS — C50211 Malignant neoplasm of upper-inner quadrant of right female breast: Secondary | ICD-10-CM

## 2019-07-26 ENCOUNTER — Encounter: Payer: Self-pay | Admitting: General Surgery

## 2019-07-26 ENCOUNTER — Ambulatory Visit: Payer: No Typology Code available for payment source

## 2019-07-27 ENCOUNTER — Other Ambulatory Visit: Payer: Self-pay

## 2019-07-27 ENCOUNTER — Ambulatory Visit
Admission: RE | Admit: 2019-07-27 | Discharge: 2019-07-27 | Disposition: A | Discharge status: Home / Self Care | Payer: No Typology Code available for payment source | Source: Ambulatory Visit | Attending: Radiation Oncology | Admitting: Radiation Oncology

## 2019-07-27 ENCOUNTER — Ambulatory Visit: Payer: No Typology Code available for payment source

## 2019-07-27 DIAGNOSIS — Z791 Long term (current) use of non-steroidal anti-inflammatories (NSAID): Secondary | ICD-10-CM | POA: Insufficient documentation

## 2019-07-27 DIAGNOSIS — Z803 Family history of malignant neoplasm of breast: Secondary | ICD-10-CM | POA: Diagnosis not present

## 2019-07-27 DIAGNOSIS — C50211 Malignant neoplasm of upper-inner quadrant of right female breast: Secondary | ICD-10-CM | POA: Insufficient documentation

## 2019-07-27 DIAGNOSIS — Z170 Estrogen receptor positive status [ER+]: Secondary | ICD-10-CM | POA: Insufficient documentation

## 2019-07-27 DIAGNOSIS — Z7982 Long term (current) use of aspirin: Secondary | ICD-10-CM | POA: Insufficient documentation

## 2019-07-27 DIAGNOSIS — Z510 Encounter for antineoplastic radiation therapy: Secondary | ICD-10-CM | POA: Insufficient documentation

## 2019-07-27 DIAGNOSIS — Z79899 Other long term (current) drug therapy: Secondary | ICD-10-CM | POA: Diagnosis not present

## 2019-07-27 DIAGNOSIS — I10 Essential (primary) hypertension: Secondary | ICD-10-CM | POA: Insufficient documentation

## 2019-07-28 ENCOUNTER — Ambulatory Visit
Admission: RE | Admit: 2019-07-28 | Discharge: 2019-07-28 | Disposition: A | Discharge status: Home / Self Care | Payer: No Typology Code available for payment source | Source: Ambulatory Visit | Attending: Radiation Oncology | Admitting: Radiation Oncology

## 2019-07-28 ENCOUNTER — Ambulatory Visit: Payer: No Typology Code available for payment source

## 2019-07-28 ENCOUNTER — Other Ambulatory Visit: Payer: Self-pay

## 2019-07-28 DIAGNOSIS — C50211 Malignant neoplasm of upper-inner quadrant of right female breast: Secondary | ICD-10-CM | POA: Diagnosis not present

## 2019-07-29 ENCOUNTER — Other Ambulatory Visit: Payer: Self-pay

## 2019-07-29 ENCOUNTER — Ambulatory Visit
Admission: RE | Admit: 2019-07-29 | Discharge: 2019-07-29 | Disposition: A | Discharge status: Home / Self Care | Payer: No Typology Code available for payment source | Source: Ambulatory Visit | Attending: Radiation Oncology | Admitting: Radiation Oncology

## 2019-07-29 DIAGNOSIS — C50211 Malignant neoplasm of upper-inner quadrant of right female breast: Secondary | ICD-10-CM | POA: Diagnosis not present

## 2019-07-30 ENCOUNTER — Ambulatory Visit: Payer: No Typology Code available for payment source

## 2019-07-30 ENCOUNTER — Other Ambulatory Visit: Payer: Self-pay

## 2019-07-30 DIAGNOSIS — C50211 Malignant neoplasm of upper-inner quadrant of right female breast: Secondary | ICD-10-CM | POA: Diagnosis not present

## 2019-08-02 ENCOUNTER — Other Ambulatory Visit: Payer: Self-pay

## 2019-08-02 ENCOUNTER — Ambulatory Visit
Admission: RE | Admit: 2019-08-02 | Discharge: 2019-08-02 | Disposition: A | Discharge status: Home / Self Care | Payer: No Typology Code available for payment source | Source: Ambulatory Visit | Attending: Radiation Oncology | Admitting: Radiation Oncology

## 2019-08-02 DIAGNOSIS — C50211 Malignant neoplasm of upper-inner quadrant of right female breast: Secondary | ICD-10-CM | POA: Diagnosis not present

## 2019-08-03 ENCOUNTER — Ambulatory Visit
Admission: RE | Admit: 2019-08-03 | Discharge: 2019-08-03 | Disposition: A | Discharge status: Home / Self Care | Payer: No Typology Code available for payment source | Source: Ambulatory Visit | Attending: Radiation Oncology | Admitting: Radiation Oncology

## 2019-08-03 ENCOUNTER — Other Ambulatory Visit: Payer: Self-pay

## 2019-08-03 DIAGNOSIS — C50211 Malignant neoplasm of upper-inner quadrant of right female breast: Secondary | ICD-10-CM | POA: Diagnosis not present

## 2019-08-04 ENCOUNTER — Other Ambulatory Visit: Payer: Self-pay

## 2019-08-04 ENCOUNTER — Ambulatory Visit
Admission: RE | Admit: 2019-08-04 | Discharge: 2019-08-04 | Disposition: A | Discharge status: Home / Self Care | Payer: No Typology Code available for payment source | Source: Ambulatory Visit | Attending: Radiation Oncology | Admitting: Radiation Oncology

## 2019-08-04 DIAGNOSIS — C50211 Malignant neoplasm of upper-inner quadrant of right female breast: Secondary | ICD-10-CM | POA: Diagnosis not present

## 2019-08-05 ENCOUNTER — Ambulatory Visit
Admission: RE | Admit: 2019-08-05 | Discharge: 2019-08-05 | Disposition: A | Discharge status: Home / Self Care | Payer: No Typology Code available for payment source | Source: Ambulatory Visit | Attending: Radiation Oncology | Admitting: Radiation Oncology

## 2019-08-05 ENCOUNTER — Other Ambulatory Visit: Payer: Self-pay

## 2019-08-05 DIAGNOSIS — C50211 Malignant neoplasm of upper-inner quadrant of right female breast: Secondary | ICD-10-CM | POA: Diagnosis not present

## 2019-08-06 ENCOUNTER — Other Ambulatory Visit: Payer: Self-pay

## 2019-08-06 ENCOUNTER — Ambulatory Visit
Admission: RE | Admit: 2019-08-06 | Discharge: 2019-08-06 | Disposition: A | Discharge status: Home / Self Care | Payer: No Typology Code available for payment source | Source: Ambulatory Visit | Attending: Radiation Oncology | Admitting: Radiation Oncology

## 2019-08-06 ENCOUNTER — Encounter: Payer: Self-pay | Admitting: Internal Medicine

## 2019-08-06 DIAGNOSIS — C50211 Malignant neoplasm of upper-inner quadrant of right female breast: Secondary | ICD-10-CM | POA: Diagnosis not present

## 2019-08-08 ENCOUNTER — Ambulatory Visit: Payer: No Typology Code available for payment source

## 2019-08-09 ENCOUNTER — Inpatient Hospital Stay: Payer: No Typology Code available for payment source | Attending: Internal Medicine | Admitting: Internal Medicine

## 2019-08-09 ENCOUNTER — Ambulatory Visit
Admission: RE | Admit: 2019-08-09 | Discharge: 2019-08-09 | Disposition: A | Discharge status: Home / Self Care | Payer: No Typology Code available for payment source | Source: Ambulatory Visit | Attending: Radiation Oncology | Admitting: Radiation Oncology

## 2019-08-09 ENCOUNTER — Other Ambulatory Visit: Payer: Self-pay

## 2019-08-09 VITALS — BP 157/83 | HR 78 | Temp 97.6°F | Wt 238.4 lb

## 2019-08-09 DIAGNOSIS — Z13820 Encounter for screening for osteoporosis: Secondary | ICD-10-CM

## 2019-08-09 DIAGNOSIS — Z170 Estrogen receptor positive status [ER+]: Secondary | ICD-10-CM | POA: Diagnosis not present

## 2019-08-09 DIAGNOSIS — C50211 Malignant neoplasm of upper-inner quadrant of right female breast: Secondary | ICD-10-CM

## 2019-08-09 NOTE — Progress Notes (Signed)
one [place] NOTE  Patient Care Team: Hortencia Pilar, MD as PCP - General (Family Medicine)  CHIEF COMPLAINTS/PURPOSE OF CONSULTATION: Breast cancer  #  Oncology History Overview Note  # OCT 2020-RIGHT BREAST, UPPER INNER QUADRANT;  INVASIVE MAMMARY CARCINOMA, NO SPECIAL TYPE. S/p Lumpectomy & SLNBx [Dr.Cintron]pT1b pN0; G-1; ER/PR >90%; her 2neu-NEG;   DIAGNOSIS: Right breast cancer  STAGE:    1     ;  GOALS: Cure  CURRENT/MOST RECENT THERAPY : awaiting RT.     Carcinoma of upper-inner quadrant of right breast in female, estrogen receptor positive ([place])  06/07/2019 Initial Diagnosis   Carcinoma of upper-inner quadrant of right breast in female, estrogen receptor positive ([place])      HISTORY OF PRESENTING ILLNESS:  Lisa Jennings 58 y.o.  female with above history of of right breast cancer is here for follow-up.  Patient is currently getting radiation; she is tolerating it fairly well.  Patient states her seroma of right breast which had previously needed aspiration-is currently improved.    Chronic mild joint pains back pain not any worse.  Review of Systems  Constitutional: Negative for chills, diaphoresis, fever, malaise/fatigue and weight loss.  HENT: Negative for nosebleeds and sore throat.   Eyes: Negative for double vision.  Respiratory: Negative for cough, hemoptysis, sputum production, shortness of breath and wheezing.   Cardiovascular: Negative for chest pain, palpitations, orthopnea and leg swelling.  Gastrointestinal: Negative for abdominal pain, blood in stool, constipation, diarrhea, heartburn, melena, nausea and vomiting.  Genitourinary: Negative for dysuria, frequency and urgency.  Musculoskeletal: Positive for back pain and joint pain.  Skin: Negative.  Negative for itching and rash.  Neurological: Negative for dizziness, tingling, focal weakness, weakness and headaches.  Endo/Heme/Allergies: Does not bruise/bleed easily.   Psychiatric/Behavioral: Negative for depression. The patient is not nervous/anxious and does not have insomnia.      MEDICAL HISTORY:  Past Medical History:  Diagnosis Date  . Breast cancer ([place])   . Carcinoma of upper-inner quadrant of right breast in female, estrogen receptor positive ([place]) 06/07/2019  . Dyspnea   . Family history of adverse reaction to anesthesia    nausea and vomiting  . GERD (gastroesophageal reflux disease)   . Headache   . History of kidney stones   . Hypertension   . Pneumonia     SURGICAL HISTORY: Past Surgical History:  Procedure Laterality Date  . APPENDECTOMY    . BREAST BIOPSY Right 2016   neg/ bx -clip  . BREAST BIOPSY Right 05/28/2019   x clip, stereo bx, invasive mammary carcinoma   . BREAST LUMPECTOMY Right 06/11/2019  . LITHOTRIPSY    . PARTIAL MASTECTOMY WITH NEEDLE LOCALIZATION AND AXILLARY SENTINEL LYMPH NODE BX Right 06/11/2019   Procedure: PARTIAL MASTECTOMY WITH NEEDLE LOCALIZATION AND AXILLARY SENTINEL LYMPH NODE BX;  Surgeon: Herbert Pun, MD;  Location: [facility] ORS;  Service: General;  Laterality: Right;  . TUBAL LIGATION    . WRIST FRACTURE SURGERY      SOCIAL HISTORY: Social History   Socioeconomic History  . Marital status: Divorced    Spouse name: Not on file  . Number of children: Not on file  . Years of education: Not on file  . Highest education level: Not on file  Occupational History  . Not on file  Tobacco Use  . Smoking status: Never Smoker  . Smokeless tobacco: Never Used  Substance and Sexual Activity  . Alcohol use: Never  . Drug use: Never  .  Sexual activity: Not Currently  Other Topics Concern  . Not on file  Social History Narrative   Lives in [place]/ with brother/ son& family. 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    Social Determinants of Health   Financial Resource Strain:   . Difficulty of Paying Living Expenses: Not on file  Food Insecurity:   . Worried About Paediatric nurse in the Last Year: Not on file  . Ran Out of Food in the Last Year: Not on file  Transportation Needs:   . Lack of Transportation (Medical): Not on file  . Lack of Transportation (Non-Medical): Not on file  Physical Activity:   . Days of Exercise per Week: Not on file  . Minutes of Exercise per Session: Not on file  Stress:   . Feeling of Stress : Not on file  Social Connections:   . Frequency of Communication with Friends and Family: Not on file  . Frequency of Social Gatherings with Friends and Family: Not on file  . Attends Religious Services: Not on file  . Active Member of Clubs or Organizations: Not on file  . Attends Archivist Meetings: Not on file  . Marital Status: Not on file  Intimate Partner Violence:   . Fear of Current or Ex-Partner: Not on file  . Emotionally Abused: Not on file  . Physically Abused: Not on file  . Sexually Abused: Not on file    FAMILY HISTORY: Family History  Problem Relation Age of Onset  . Breast cancer Maternal Aunt 64  . Diabetes Mother     ALLERGIES:  has No Known Allergies.  MEDICATIONS:  Current Outpatient Medications  Medication Sig Dispense Refill  . acetaminophen (TYLENOL) 500 MG tablet Take 1,000 mg by mouth every 6 (six) hours as needed for moderate pain.    Marland Kitchen aspirin EC 81 MG tablet Take 81 mg by mouth daily.    . Calcium Carb-Cholecalciferol (CALCIUM 600+D) 600-800 MG-UNIT TABS Take 1 tablet by mouth daily.    . meloxicam (MOBIC) 15 MG tablet Take 15 mg by mouth daily.    . Multiple Vitamins-Minerals (CENTRUM SILVER ADULT 50+ PO) Take 1 tablet by mouth daily.    . valsartan-hydrochlorothiazide (DIOVAN-HCT) 160-12.5 MG tablet Take 1 tablet by mouth daily.     No current facility-administered medications for this visit.      Marland Kitchen  PHYSICAL EXAMINATION: ECOG PERFORMANCE STATUS: 0 - Asymptomatic  Vitals:   08/09/19 1017  BP: (!) 157/83  Pulse: 78  Temp: 97.6 F (36.4 C)   Filed Weights    08/09/19 1017  Weight: 238 lb 6.4 oz (108.1 kg)    Physical Exam  Constitutional: She is oriented to person, place, and time and well-developed, well-nourished, and in no distress.  HENT:  Head: Normocephalic and atraumatic.  Mouth/Throat: Oropharynx is clear and moist. No oropharyngeal exudate.  Eyes: Pupils are equal, round, and reactive to light.  Cardiovascular: Normal rate and regular rhythm.  Pulmonary/Chest: No respiratory distress. She has no wheezes.  Abdominal: Soft. Bowel sounds are normal. She exhibits no distension and no mass. There is no abdominal tenderness. There is no rebound and no guarding.  Musculoskeletal:        General: No tenderness or edema. Normal range of motion.     Cervical back: Normal range of motion and neck supple.  Neurological: She is alert and oriented to person, place, and time.  Skin: Skin is warm.  Psychiatric: Affect normal.  LABORATORY DATA:  I have reviewed the data as listed No results found for: WBC, HGB, HCT, MCV, PLT No results for input(s): NA, K, CL, CO2, GLUCOSE, BUN, CREATININE, CALCIUM, GFRNONAA, GFRAA, PROT, ALBUMIN, AST, ALT, ALKPHOS, BILITOT, BILIDIR, IBILI in the last 8760 hours.  RADIOGRAPHIC STUDIES: I have personally reviewed the radiological images as listed and agreed with the findings in the report. No results found.  ASSESSMENT & PLAN:   Carcinoma of upper-inner quadrant of right breast in female, estrogen receptor positive ([place]) #  Stage I ER PR positive HER-2 negative breast cancer s/p Lumpectomy & SLNBx.  Given G-1/ no LVI; no Oncotype testing. On RT; until Jan 22nd 2021.  # #Discussed the mechanism of action of aromatase inhibitors-with blocking of estrogen to prevent breast cancer.  Also discussed the potential side effects including but not limited to arthralgias hot flashes and increased risk of osteoporosis. Will prescrbe at next viist.   #Screening for osteoporosis-discussed regarding bone density test as  a baseline prior to starting aromatase inhibitor.  # Right breast post lumpectomy serous drainage-expected post surgery- improved.   # DISPOSITION:  # 1st week of Feb 2020- MD; no labs; Bone density test prior- Dr.B   All questions were answered. The patient/family knows to call the clinic with any problems, questions or concerns.    Cammie Sickle, MD 08/09/2019 11:13 AM

## 2019-08-09 NOTE — Assessment & Plan Note (Addendum)
#  Stage I ER PR positive HER-2 negative breast cancer s/p Lumpectomy & SLNBx.  Given G-1/ no LVI; no Oncotype testing. On RT; until Jan 22nd 2021.  # #Discussed the mechanism of action of aromatase inhibitors-with blocking of estrogen to prevent breast cancer.  Also discussed the potential side effects including but not limited to arthralgias hot flashes and increased risk of osteoporosis. Will prescrbe at next viist.   #Screening for osteoporosis-discussed regarding bone density test as a baseline prior to starting aromatase inhibitor.  # Right breast post lumpectomy serous drainage-expected post surgery- improved.   # DISPOSITION:  # 1st week of Feb 2020- MD; no labs; Bone density test prior- Dr.B

## 2019-08-10 ENCOUNTER — Other Ambulatory Visit: Payer: Self-pay

## 2019-08-10 ENCOUNTER — Ambulatory Visit
Admission: RE | Admit: 2019-08-10 | Discharge: 2019-08-10 | Disposition: A | Discharge status: Home / Self Care | Payer: No Typology Code available for payment source | Source: Ambulatory Visit | Attending: Radiation Oncology | Admitting: Radiation Oncology

## 2019-08-10 DIAGNOSIS — C50211 Malignant neoplasm of upper-inner quadrant of right female breast: Secondary | ICD-10-CM | POA: Diagnosis not present

## 2019-08-11 ENCOUNTER — Ambulatory Visit
Admission: RE | Admit: 2019-08-11 | Discharge: 2019-08-11 | Disposition: A | Discharge status: Home / Self Care | Payer: No Typology Code available for payment source | Source: Ambulatory Visit | Attending: Radiation Oncology | Admitting: Radiation Oncology

## 2019-08-11 ENCOUNTER — Other Ambulatory Visit: Payer: Self-pay

## 2019-08-11 ENCOUNTER — Inpatient Hospital Stay: Payer: No Typology Code available for payment source

## 2019-08-11 ENCOUNTER — Other Ambulatory Visit: Payer: 59

## 2019-08-11 DIAGNOSIS — C50211 Malignant neoplasm of upper-inner quadrant of right female breast: Secondary | ICD-10-CM | POA: Diagnosis not present

## 2019-08-11 LAB — CBC
HCT: 45.5 % (ref 36.0–46.0)
Hemoglobin: 14.6 g/dL (ref 12.0–15.0)
MCH: 29.3 pg (ref 26.0–34.0)
MCHC: 32.1 g/dL (ref 30.0–36.0)
MCV: 91.4 fL (ref 80.0–100.0)
Platelets: 262 10*3/uL (ref 150–400)
RBC: 4.98 MIL/uL (ref 3.87–5.11)
RDW: 13.7 % (ref 11.5–15.5)
WBC: 7 10*3/uL (ref 4.0–10.5)
nRBC: 0 % (ref 0.0–0.2)

## 2019-08-12 ENCOUNTER — Ambulatory Visit
Admission: RE | Admit: 2019-08-12 | Discharge: 2019-08-12 | Disposition: A | Discharge status: Home / Self Care | Payer: No Typology Code available for payment source | Source: Ambulatory Visit | Attending: Radiation Oncology | Admitting: Radiation Oncology

## 2019-08-12 ENCOUNTER — Other Ambulatory Visit: Payer: Self-pay

## 2019-08-12 DIAGNOSIS — C50211 Malignant neoplasm of upper-inner quadrant of right female breast: Secondary | ICD-10-CM | POA: Diagnosis not present

## 2019-08-13 ENCOUNTER — Other Ambulatory Visit: Payer: Self-pay

## 2019-08-13 ENCOUNTER — Ambulatory Visit
Admission: RE | Admit: 2019-08-13 | Discharge: 2019-08-13 | Disposition: A | Discharge status: Home / Self Care | Payer: No Typology Code available for payment source | Source: Ambulatory Visit | Attending: Radiation Oncology | Admitting: Radiation Oncology

## 2019-08-13 DIAGNOSIS — C50211 Malignant neoplasm of upper-inner quadrant of right female breast: Secondary | ICD-10-CM | POA: Diagnosis not present

## 2019-08-15 ENCOUNTER — Ambulatory Visit: Payer: No Typology Code available for payment source

## 2019-08-16 ENCOUNTER — Ambulatory Visit
Admission: RE | Admit: 2019-08-16 | Discharge: 2019-08-16 | Disposition: A | Discharge status: Home / Self Care | Payer: No Typology Code available for payment source | Source: Ambulatory Visit | Attending: Radiation Oncology | Admitting: Radiation Oncology

## 2019-08-16 ENCOUNTER — Other Ambulatory Visit: Payer: Self-pay

## 2019-08-16 DIAGNOSIS — C50211 Malignant neoplasm of upper-inner quadrant of right female breast: Secondary | ICD-10-CM | POA: Diagnosis not present

## 2019-08-17 ENCOUNTER — Other Ambulatory Visit: Payer: Self-pay

## 2019-08-17 ENCOUNTER — Ambulatory Visit
Admission: RE | Admit: 2019-08-17 | Discharge: 2019-08-17 | Disposition: A | Discharge status: Home / Self Care | Payer: No Typology Code available for payment source | Source: Ambulatory Visit | Attending: Radiation Oncology | Admitting: Radiation Oncology

## 2019-08-17 DIAGNOSIS — C50211 Malignant neoplasm of upper-inner quadrant of right female breast: Secondary | ICD-10-CM | POA: Diagnosis not present

## 2019-08-18 ENCOUNTER — Ambulatory Visit
Admission: RE | Admit: 2019-08-18 | Discharge: 2019-08-18 | Disposition: A | Discharge status: Home / Self Care | Payer: No Typology Code available for payment source | Source: Ambulatory Visit | Attending: Radiation Oncology | Admitting: Radiation Oncology

## 2019-08-18 ENCOUNTER — Other Ambulatory Visit: Payer: Self-pay

## 2019-08-18 DIAGNOSIS — C50211 Malignant neoplasm of upper-inner quadrant of right female breast: Secondary | ICD-10-CM | POA: Diagnosis not present

## 2019-08-19 ENCOUNTER — Ambulatory Visit
Admission: RE | Admit: 2019-08-19 | Discharge: 2019-08-19 | Disposition: A | Discharge status: Home / Self Care | Payer: No Typology Code available for payment source | Source: Ambulatory Visit | Attending: Radiation Oncology | Admitting: Radiation Oncology

## 2019-08-19 ENCOUNTER — Other Ambulatory Visit: Payer: Self-pay

## 2019-08-19 DIAGNOSIS — C50211 Malignant neoplasm of upper-inner quadrant of right female breast: Secondary | ICD-10-CM | POA: Diagnosis not present

## 2019-08-23 ENCOUNTER — Other Ambulatory Visit: Payer: Self-pay

## 2019-08-23 ENCOUNTER — Ambulatory Visit
Admission: RE | Admit: 2019-08-23 | Discharge: 2019-08-23 | Disposition: A | Discharge status: Home / Self Care | Payer: No Typology Code available for payment source | Source: Ambulatory Visit | Attending: Radiation Oncology | Admitting: Radiation Oncology

## 2019-08-23 DIAGNOSIS — C50211 Malignant neoplasm of upper-inner quadrant of right female breast: Secondary | ICD-10-CM | POA: Diagnosis not present

## 2019-08-24 ENCOUNTER — Other Ambulatory Visit: Payer: 59

## 2019-08-24 ENCOUNTER — Ambulatory Visit
Admission: RE | Admit: 2019-08-24 | Discharge: 2019-08-24 | Disposition: A | Discharge status: Home / Self Care | Payer: No Typology Code available for payment source | Source: Ambulatory Visit | Attending: Radiation Oncology | Admitting: Radiation Oncology

## 2019-08-24 ENCOUNTER — Other Ambulatory Visit: Payer: Self-pay

## 2019-08-24 ENCOUNTER — Other Ambulatory Visit: Payer: Self-pay | Admitting: *Deleted

## 2019-08-24 DIAGNOSIS — C50211 Malignant neoplasm of upper-inner quadrant of right female breast: Secondary | ICD-10-CM | POA: Diagnosis not present

## 2019-08-24 MED ORDER — SILVER SULFADIAZINE 1 % EX CREA: 1.0000 "application " | TOPICAL_CREAM | Freq: Two times a day (BID) | CUTANEOUS | 2 refills | Status: DC

## 2019-08-25 ENCOUNTER — Ambulatory Visit: Payer: No Typology Code available for payment source

## 2019-08-25 ENCOUNTER — Inpatient Hospital Stay: Payer: No Typology Code available for payment source

## 2019-08-26 ENCOUNTER — Ambulatory Visit: Payer: No Typology Code available for payment source

## 2019-08-26 ENCOUNTER — Other Ambulatory Visit: Payer: Self-pay

## 2019-08-30 ENCOUNTER — Ambulatory Visit
Admission: RE | Admit: 2019-08-30 | Discharge: 2019-08-30 | Disposition: A | Discharge status: Home / Self Care | Payer: No Typology Code available for payment source | Source: Ambulatory Visit | Attending: Radiation Oncology | Admitting: Radiation Oncology

## 2019-08-30 ENCOUNTER — Other Ambulatory Visit: Payer: Self-pay

## 2019-08-30 ENCOUNTER — Inpatient Hospital Stay: Payer: No Typology Code available for payment source

## 2019-08-30 DIAGNOSIS — Z510 Encounter for antineoplastic radiation therapy: Secondary | ICD-10-CM | POA: Diagnosis present

## 2019-08-30 DIAGNOSIS — C50211 Malignant neoplasm of upper-inner quadrant of right female breast: Secondary | ICD-10-CM | POA: Insufficient documentation

## 2019-08-30 DIAGNOSIS — Z170 Estrogen receptor positive status [ER+]: Secondary | ICD-10-CM

## 2019-08-30 LAB — CBC
HCT: 42.1 % (ref 36.0–46.0)
Hemoglobin: 13.5 g/dL (ref 12.0–15.0)
MCH: 29.2 pg (ref 26.0–34.0)
MCHC: 32.1 g/dL (ref 30.0–36.0)
MCV: 91.1 fL (ref 80.0–100.0)
Platelets: 251 10*3/uL (ref 150–400)
RBC: 4.62 MIL/uL (ref 3.87–5.11)
RDW: 13.6 % (ref 11.5–15.5)
WBC: 9.7 10*3/uL (ref 4.0–10.5)
nRBC: 0 % (ref 0.0–0.2)

## 2019-08-31 ENCOUNTER — Other Ambulatory Visit: Payer: Self-pay

## 2019-08-31 ENCOUNTER — Ambulatory Visit
Admission: RE | Admit: 2019-08-31 | Discharge: 2019-08-31 | Disposition: A | Discharge status: Home / Self Care | Payer: No Typology Code available for payment source | Source: Ambulatory Visit | Attending: Radiation Oncology | Admitting: Radiation Oncology

## 2019-08-31 DIAGNOSIS — C50211 Malignant neoplasm of upper-inner quadrant of right female breast: Secondary | ICD-10-CM | POA: Diagnosis not present

## 2019-09-01 ENCOUNTER — Ambulatory Visit
Admission: RE | Admit: 2019-09-01 | Discharge: 2019-09-01 | Disposition: A | Discharge status: Home / Self Care | Payer: No Typology Code available for payment source | Source: Ambulatory Visit | Attending: Radiation Oncology | Admitting: Radiation Oncology

## 2019-09-01 ENCOUNTER — Other Ambulatory Visit: Payer: Self-pay

## 2019-09-01 DIAGNOSIS — C50211 Malignant neoplasm of upper-inner quadrant of right female breast: Secondary | ICD-10-CM | POA: Diagnosis not present

## 2019-09-02 ENCOUNTER — Other Ambulatory Visit: Payer: Self-pay

## 2019-09-02 ENCOUNTER — Ambulatory Visit: Payer: No Typology Code available for payment source

## 2019-09-02 ENCOUNTER — Ambulatory Visit
Admission: RE | Admit: 2019-09-02 | Discharge: 2019-09-02 | Disposition: A | Discharge status: Home / Self Care | Payer: No Typology Code available for payment source | Source: Ambulatory Visit | Attending: Radiation Oncology | Admitting: Radiation Oncology

## 2019-09-02 DIAGNOSIS — C50211 Malignant neoplasm of upper-inner quadrant of right female breast: Secondary | ICD-10-CM | POA: Diagnosis not present

## 2019-09-03 ENCOUNTER — Ambulatory Visit
Admission: RE | Admit: 2019-09-03 | Discharge: 2019-09-03 | Disposition: A | Discharge status: Home / Self Care | Payer: No Typology Code available for payment source | Source: Ambulatory Visit | Attending: Radiation Oncology | Admitting: Radiation Oncology

## 2019-09-03 ENCOUNTER — Other Ambulatory Visit: Payer: Self-pay

## 2019-09-03 DIAGNOSIS — C50211 Malignant neoplasm of upper-inner quadrant of right female breast: Secondary | ICD-10-CM | POA: Diagnosis not present

## 2019-09-06 ENCOUNTER — Ambulatory Visit
Admission: RE | Admit: 2019-09-06 | Discharge: 2019-09-06 | Disposition: A | Discharge status: Home / Self Care | Payer: No Typology Code available for payment source | Source: Ambulatory Visit | Attending: Radiation Oncology | Admitting: Radiation Oncology

## 2019-09-06 ENCOUNTER — Other Ambulatory Visit: Payer: Self-pay

## 2019-09-06 DIAGNOSIS — C50211 Malignant neoplasm of upper-inner quadrant of right female breast: Secondary | ICD-10-CM | POA: Diagnosis not present

## 2019-09-07 ENCOUNTER — Other Ambulatory Visit: Payer: Self-pay

## 2019-09-07 ENCOUNTER — Ambulatory Visit
Admission: RE | Admit: 2019-09-07 | Discharge: 2019-09-07 | Disposition: A | Discharge status: Home / Self Care | Payer: No Typology Code available for payment source | Source: Ambulatory Visit | Attending: Radiation Oncology | Admitting: Radiation Oncology

## 2019-09-07 ENCOUNTER — Ambulatory Visit: Payer: No Typology Code available for payment source

## 2019-09-07 DIAGNOSIS — C50211 Malignant neoplasm of upper-inner quadrant of right female breast: Secondary | ICD-10-CM | POA: Diagnosis not present

## 2019-09-08 ENCOUNTER — Ambulatory Visit
Admission: RE | Admit: 2019-09-08 | Discharge: 2019-09-08 | Disposition: A | Discharge status: Home / Self Care | Payer: No Typology Code available for payment source | Source: Ambulatory Visit | Attending: Radiation Oncology | Admitting: Radiation Oncology

## 2019-09-08 ENCOUNTER — Ambulatory Visit: Payer: No Typology Code available for payment source

## 2019-09-08 ENCOUNTER — Inpatient Hospital Stay: Payer: No Typology Code available for payment source

## 2019-09-08 ENCOUNTER — Other Ambulatory Visit: Payer: Self-pay

## 2019-09-08 DIAGNOSIS — C50211 Malignant neoplasm of upper-inner quadrant of right female breast: Secondary | ICD-10-CM

## 2019-09-08 LAB — CBC
HCT: 40.3 % (ref 36.0–46.0)
Hemoglobin: 12.9 g/dL (ref 12.0–15.0)
MCH: 29.6 pg (ref 26.0–34.0)
MCHC: 32 g/dL (ref 30.0–36.0)
MCV: 92.4 fL (ref 80.0–100.0)
Platelets: 257 10*3/uL (ref 150–400)
RBC: 4.36 MIL/uL (ref 3.87–5.11)
RDW: 13.6 % (ref 11.5–15.5)
WBC: 7 10*3/uL (ref 4.0–10.5)
nRBC: 0 % (ref 0.0–0.2)

## 2019-09-09 ENCOUNTER — Ambulatory Visit: Payer: No Typology Code available for payment source

## 2019-09-09 ENCOUNTER — Ambulatory Visit
Admission: RE | Admit: 2019-09-09 | Discharge: 2019-09-09 | Disposition: A | Discharge status: Home / Self Care | Payer: No Typology Code available for payment source | Source: Ambulatory Visit | Attending: Radiation Oncology | Admitting: Radiation Oncology

## 2019-09-09 ENCOUNTER — Other Ambulatory Visit: Payer: Self-pay

## 2019-09-09 DIAGNOSIS — C50211 Malignant neoplasm of upper-inner quadrant of right female breast: Secondary | ICD-10-CM | POA: Diagnosis not present

## 2019-09-10 ENCOUNTER — Ambulatory Visit
Admission: RE | Admit: 2019-09-10 | Discharge: 2019-09-10 | Disposition: A | Discharge status: Home / Self Care | Payer: No Typology Code available for payment source | Source: Ambulatory Visit | Attending: Radiation Oncology | Admitting: Radiation Oncology

## 2019-09-10 ENCOUNTER — Other Ambulatory Visit: Payer: Self-pay

## 2019-09-10 DIAGNOSIS — C50211 Malignant neoplasm of upper-inner quadrant of right female breast: Secondary | ICD-10-CM | POA: Diagnosis not present

## 2019-09-13 ENCOUNTER — Ambulatory Visit
Admission: RE | Admit: 2019-09-13 | Discharge: 2019-09-13 | Disposition: A | Discharge status: Home / Self Care | Payer: No Typology Code available for payment source | Source: Ambulatory Visit | Attending: Radiation Oncology | Admitting: Radiation Oncology

## 2019-09-13 ENCOUNTER — Other Ambulatory Visit: Payer: Self-pay

## 2019-09-13 DIAGNOSIS — C50211 Malignant neoplasm of upper-inner quadrant of right female breast: Secondary | ICD-10-CM | POA: Diagnosis not present

## 2019-09-14 ENCOUNTER — Ambulatory Visit
Admission: RE | Admit: 2019-09-14 | Discharge: 2019-09-14 | Disposition: A | Discharge status: Home / Self Care | Payer: No Typology Code available for payment source | Source: Ambulatory Visit | Attending: Radiation Oncology | Admitting: Radiation Oncology

## 2019-09-14 ENCOUNTER — Other Ambulatory Visit: Payer: Self-pay

## 2019-09-14 DIAGNOSIS — C50211 Malignant neoplasm of upper-inner quadrant of right female breast: Secondary | ICD-10-CM | POA: Diagnosis not present

## 2019-09-15 ENCOUNTER — Other Ambulatory Visit: Payer: Self-pay

## 2019-09-15 ENCOUNTER — Ambulatory Visit
Admission: RE | Admit: 2019-09-15 | Discharge: 2019-09-15 | Disposition: A | Discharge status: Home / Self Care | Payer: No Typology Code available for payment source | Source: Ambulatory Visit | Attending: Radiation Oncology | Admitting: Radiation Oncology

## 2019-09-15 DIAGNOSIS — C50211 Malignant neoplasm of upper-inner quadrant of right female breast: Secondary | ICD-10-CM | POA: Diagnosis not present

## 2019-09-16 ENCOUNTER — Ambulatory Visit
Admission: RE | Admit: 2019-09-16 | Discharge: 2019-09-16 | Disposition: A | Discharge status: Home / Self Care | Payer: No Typology Code available for payment source | Source: Ambulatory Visit | Attending: Radiation Oncology | Admitting: Radiation Oncology

## 2019-09-16 ENCOUNTER — Other Ambulatory Visit: Payer: Self-pay

## 2019-09-16 ENCOUNTER — Ambulatory Visit: Payer: No Typology Code available for payment source

## 2019-09-16 DIAGNOSIS — C50211 Malignant neoplasm of upper-inner quadrant of right female breast: Secondary | ICD-10-CM | POA: Diagnosis not present

## 2019-09-17 ENCOUNTER — Other Ambulatory Visit: Payer: Self-pay

## 2019-09-17 ENCOUNTER — Ambulatory Visit: Payer: No Typology Code available for payment source

## 2019-09-17 ENCOUNTER — Ambulatory Visit
Admission: RE | Admit: 2019-09-17 | Discharge: 2019-09-17 | Disposition: A | Discharge status: Home / Self Care | Payer: No Typology Code available for payment source | Source: Ambulatory Visit | Attending: Radiation Oncology | Admitting: Radiation Oncology

## 2019-09-17 DIAGNOSIS — C50211 Malignant neoplasm of upper-inner quadrant of right female breast: Secondary | ICD-10-CM | POA: Diagnosis not present

## 2019-09-20 ENCOUNTER — Ambulatory Visit: Payer: No Typology Code available for payment source

## 2019-09-20 ENCOUNTER — Ambulatory Visit
Admission: RE | Admit: 2019-09-20 | Discharge: 2019-09-20 | Disposition: A | Discharge status: Home / Self Care | Payer: No Typology Code available for payment source | Source: Ambulatory Visit | Attending: Radiation Oncology | Admitting: Radiation Oncology

## 2019-09-20 ENCOUNTER — Other Ambulatory Visit: Payer: Self-pay

## 2019-09-20 DIAGNOSIS — C50211 Malignant neoplasm of upper-inner quadrant of right female breast: Secondary | ICD-10-CM | POA: Diagnosis not present

## 2019-09-21 ENCOUNTER — Other Ambulatory Visit: Payer: Self-pay

## 2019-09-21 ENCOUNTER — Ambulatory Visit
Admission: RE | Admit: 2019-09-21 | Discharge: 2019-09-21 | Disposition: A | Discharge status: Home / Self Care | Payer: No Typology Code available for payment source | Source: Ambulatory Visit | Attending: Radiation Oncology | Admitting: Radiation Oncology

## 2019-09-21 DIAGNOSIS — C50211 Malignant neoplasm of upper-inner quadrant of right female breast: Secondary | ICD-10-CM | POA: Diagnosis not present

## 2019-09-22 ENCOUNTER — Ambulatory Visit
Admission: RE | Admit: 2019-09-22 | Discharge: 2019-09-22 | Disposition: A | Discharge status: Home / Self Care | Payer: 59 | Source: Ambulatory Visit | Attending: Internal Medicine | Admitting: Internal Medicine

## 2019-09-22 DIAGNOSIS — Z13820 Encounter for screening for osteoporosis: Secondary | ICD-10-CM | POA: Diagnosis present

## 2019-09-22 DIAGNOSIS — C50211 Malignant neoplasm of upper-inner quadrant of right female breast: Secondary | ICD-10-CM | POA: Diagnosis present

## 2019-09-22 DIAGNOSIS — Z170 Estrogen receptor positive status [ER+]: Secondary | ICD-10-CM | POA: Diagnosis present

## 2019-09-24 ENCOUNTER — Other Ambulatory Visit: Payer: Self-pay

## 2019-09-24 NOTE — Progress Notes (Signed)
Patient pre screened for office appointment, no questions or concerns today. Patient reminded of upcoming appointment time and date. 

## 2019-09-27 ENCOUNTER — Other Ambulatory Visit: Payer: Self-pay

## 2019-09-27 ENCOUNTER — Inpatient Hospital Stay: Payer: 59 | Attending: Internal Medicine | Admitting: Internal Medicine

## 2019-09-27 ENCOUNTER — Telehealth: Payer: Self-pay

## 2019-09-27 DIAGNOSIS — Z7982 Long term (current) use of aspirin: Secondary | ICD-10-CM | POA: Insufficient documentation

## 2019-09-27 DIAGNOSIS — R232 Flushing: Secondary | ICD-10-CM | POA: Diagnosis not present

## 2019-09-27 DIAGNOSIS — Z87442 Personal history of urinary calculi: Secondary | ICD-10-CM | POA: Diagnosis not present

## 2019-09-27 DIAGNOSIS — Z923 Personal history of irradiation: Secondary | ICD-10-CM | POA: Insufficient documentation

## 2019-09-27 DIAGNOSIS — Z79811 Long term (current) use of aromatase inhibitors: Secondary | ICD-10-CM | POA: Diagnosis not present

## 2019-09-27 DIAGNOSIS — I10 Essential (primary) hypertension: Secondary | ICD-10-CM | POA: Diagnosis not present

## 2019-09-27 DIAGNOSIS — C50211 Malignant neoplasm of upper-inner quadrant of right female breast: Secondary | ICD-10-CM | POA: Diagnosis present

## 2019-09-27 DIAGNOSIS — Z791 Long term (current) use of non-steroidal anti-inflammatories (NSAID): Secondary | ICD-10-CM | POA: Insufficient documentation

## 2019-09-27 DIAGNOSIS — R5383 Other fatigue: Secondary | ICD-10-CM | POA: Insufficient documentation

## 2019-09-27 DIAGNOSIS — K219 Gastro-esophageal reflux disease without esophagitis: Secondary | ICD-10-CM | POA: Insufficient documentation

## 2019-09-27 DIAGNOSIS — Z170 Estrogen receptor positive status [ER+]: Secondary | ICD-10-CM | POA: Insufficient documentation

## 2019-09-27 DIAGNOSIS — Z79899 Other long term (current) drug therapy: Secondary | ICD-10-CM | POA: Insufficient documentation

## 2019-09-27 MED ORDER — ANASTROZOLE 1 MG PO TABS: 1.0000 mg | ORAL_TABLET | Freq: Every day | ORAL | 3 refills | Status: DC

## 2019-09-27 NOTE — Progress Notes (Signed)
one [place] NOTE  Patient Care Team: Hortencia Pilar, MD as PCP - General (Family Medicine)  CHIEF COMPLAINTS/PURPOSE OF CONSULTATION: Breast cancer  #  Oncology History Overview Note  # OCT 2020-RIGHT BREAST, UPPER INNER QUADRANT;  INVASIVE MAMMARY CARCINOMA, NO SPECIAL TYPE. S/p Lumpectomy & SLNBx [Dr.Cintron]pT1b pN0; G-1; ER/PR >90%; her 2neu-NEG;   # feb 1st 2021- Arimidex  # survivorship: pending; LMP- 2011/ No TAH or BSO  DIAGNOSIS: Right breast cancer  STAGE:    1     ;  GOALS: Cure  CURRENT/MOST RECENT THERAPY : anastrazole    Carcinoma of upper-inner quadrant of right breast in female, estrogen receptor positive ([place])  06/07/2019 Initial Diagnosis   Carcinoma of upper-inner quadrant of right breast in female, estrogen receptor positive ([place])      HISTORY OF PRESENTING ILLNESS:  Lisa Jennings 59 y.o.  female with above history of of right breast cancer is here for follow-up.  Patient is currently status post radiation approximately 2 weeks ago.  Patient had radiation skin burn-s/p Silvadene ointment.  Currently improved.  Denies any lumps or bumps.  Appetite is good.  No weight loss.  No nausea no vomiting.  Mild hot flashes not any worse.  Review of Systems  Constitutional: Negative for chills, diaphoresis, fever, malaise/fatigue and weight loss.  HENT: Negative for nosebleeds and sore throat.   Eyes: Negative for double vision.  Respiratory: Negative for cough, hemoptysis, sputum production, shortness of breath and wheezing.   Cardiovascular: Negative for chest pain, palpitations, orthopnea and leg swelling.  Gastrointestinal: Negative for abdominal pain, blood in stool, constipation, diarrhea, heartburn, melena, nausea and vomiting.  Genitourinary: Negative for dysuria, frequency and urgency.  Musculoskeletal: Positive for back pain and joint pain.  Skin: Negative.  Negative for itching and rash.  Neurological: Negative for  dizziness, tingling, focal weakness, weakness and headaches.  Endo/Heme/Allergies: Does not bruise/bleed easily.  Psychiatric/Behavioral: Negative for depression. The patient is not nervous/anxious and does not have insomnia.      MEDICAL HISTORY:  Past Medical History:  Diagnosis Date  . Breast cancer ([place])   . Carcinoma of upper-inner quadrant of right breast in female, estrogen receptor positive ([place]) 06/07/2019  . Dyspnea   . Family history of adverse reaction to anesthesia    nausea and vomiting  . GERD (gastroesophageal reflux disease)   . Headache   . History of kidney stones   . Hypertension   . Pneumonia     SURGICAL HISTORY: Past Surgical History:  Procedure Laterality Date  . APPENDECTOMY    . BREAST BIOPSY Right 2016   neg/ bx -clip  . BREAST BIOPSY Right 05/28/2019   x clip, stereo bx, invasive mammary carcinoma   . BREAST LUMPECTOMY Right 06/11/2019  . LITHOTRIPSY    . PARTIAL MASTECTOMY WITH NEEDLE LOCALIZATION AND AXILLARY SENTINEL LYMPH NODE BX Right 06/11/2019   Procedure: PARTIAL MASTECTOMY WITH NEEDLE LOCALIZATION AND AXILLARY SENTINEL LYMPH NODE BX;  Surgeon: Herbert Pun, MD;  Location: [facility] ORS;  Service: General;  Laterality: Right;  . TUBAL LIGATION    . WRIST FRACTURE SURGERY      SOCIAL HISTORY: Social History   Socioeconomic History  . Marital status: Divorced    Spouse name: Not on file  . Number of children: Not on file  . Years of education: Not on file  . Highest education level: Not on file  Occupational History  . Not on file  Tobacco Use  . Smoking status: Never  Smoker  . Smokeless tobacco: Never Used  Substance and Sexual Activity  . Alcohol use: Never  . Drug use: Never  . Sexual activity: Not Currently  Other Topics Concern  . Not on file  Social History Narrative   Lives in [place]/ with brother/ son& family. 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    Social Determinants of Health   Financial  Resource Strain:   . Difficulty of Paying Living Expenses: Not on file  Food Insecurity:   . Worried About Charity fundraiser in the Last Year: Not on file  . Ran Out of Food in the Last Year: Not on file  Transportation Needs:   . Lack of Transportation (Medical): Not on file  . Lack of Transportation (Non-Medical): Not on file  Physical Activity:   . Days of Exercise per Week: Not on file  . Minutes of Exercise per Session: Not on file  Stress:   . Feeling of Stress : Not on file  Social Connections:   . Frequency of Communication with Friends and Family: Not on file  . Frequency of Social Gatherings with Friends and Family: Not on file  . Attends Religious Services: Not on file  . Active Member of Clubs or Organizations: Not on file  . Attends Archivist Meetings: Not on file  . Marital Status: Not on file  Intimate Partner Violence:   . Fear of Current or Ex-Partner: Not on file  . Emotionally Abused: Not on file  . Physically Abused: Not on file  . Sexually Abused: Not on file    FAMILY HISTORY: Family History  Problem Relation Age of Onset  . Breast cancer Maternal Aunt 64  . Diabetes Mother     ALLERGIES:  has No Known Allergies.  MEDICATIONS:  Current Outpatient Medications  Medication Sig Dispense Refill  . acetaminophen (TYLENOL) 500 MG tablet Take 1,000 mg by mouth every 6 (six) hours as needed for moderate pain.    Marland Kitchen aspirin EC 81 MG tablet Take 81 mg by mouth daily.    . Calcium Carb-Cholecalciferol (CALCIUM 600+D) 600-800 MG-UNIT TABS Take 1 tablet by mouth daily.    . meloxicam (MOBIC) 15 MG tablet Take 15 mg by mouth daily.    . Multiple Vitamins-Minerals (CENTRUM SILVER ADULT 50+ PO) Take 1 tablet by mouth daily.    . silver sulfADIAZINE (SILVADENE) 1 % cream Apply 1 application topically 2 (two) times daily. 50 g 2  . valsartan-hydrochlorothiazide (DIOVAN-HCT) 160-12.5 MG tablet Take 1 tablet by mouth daily.    Marland Kitchen anastrozole (ARIMIDEX) 1 MG  tablet Take 1 tablet (1 mg total) by mouth daily. 90 tablet 3   No current facility-administered medications for this visit.      Marland Kitchen  PHYSICAL EXAMINATION: ECOG PERFORMANCE STATUS: 0 - Asymptomatic  Vitals:   09/27/19 1007  BP: (!) 154/109  Pulse: 78  Temp: 97.7 F (36.5 C)   Filed Weights   09/27/19 1007  Weight: 243 lb (110.2 kg)    Physical Exam  Constitutional: She is oriented to person, place, and time and well-developed, well-nourished, and in no distress.  HENT:  Head: Normocephalic and atraumatic.  Mouth/Throat: Oropharynx is clear and moist. No oropharyngeal exudate.  Eyes: Pupils are equal, round, and reactive to light.  Cardiovascular: Normal rate and regular rhythm.  Pulmonary/Chest: No respiratory distress. She has no wheezes.  Abdominal: Soft. Bowel sounds are normal. She exhibits no distension and no mass. There is no abdominal tenderness. There  is no rebound and no guarding.  Musculoskeletal:        General: No tenderness or edema. Normal range of motion.     Cervical back: Normal range of motion and neck supple.  Neurological: She is alert and oriented to person, place, and time.  Skin: Skin is warm.  Psychiatric: Affect normal.     LABORATORY DATA:  I have reviewed the data as listed Lab Results  Component Value Date   WBC 7.0 09/08/2019   HGB 12.9 09/08/2019   HCT 40.3 09/08/2019   MCV 92.4 09/08/2019   PLT 257 09/08/2019   No results for input(s): NA, K, CL, CO2, GLUCOSE, BUN, CREATININE, CALCIUM, GFRNONAA, GFRAA, PROT, ALBUMIN, AST, ALT, ALKPHOS, BILITOT, BILIDIR, IBILI in the last 8760 hours.  RADIOGRAPHIC STUDIES: I have personally reviewed the radiological images as listed and agreed with the findings in the report. DG Bone Density  Result Date: 09/22/2019 EXAM: DUAL X-RAY ABSORPTIOMETRY (DXA) FOR BONE MINERAL DENSITY IMPRESSION: Your patient Lisa Jennings completed a BMD test on 09/22/2019 using the [place] (software  version: 14.10) manufactured by [employer]. The following summarizes the results of our evaluation. Technologist:VLM PATIENT BIOGRAPHICAL: Name: Lisa Jennings, Lisa Jennings Patient ID: [id number] Birth Date: 05-31-1961 Height: 64.0 in. Gender: Female Exam Date: 09/22/2019 Weight: 241.0 lbs. Indications: Caucasian, History of Breast Cancer, History of Fracture (Adult), History of Radiation, Postmenopausal Fractures: Left wrist Treatments: calcium w/ vit D DENSITOMETRY RESULTS: Site          Region        Measured Date Measured Age WHO Classification Young Adult T-score BMD         %Change vs. Previous Significant Change (*) AP Spine L1-L4 (L2,L3) 09/22/2019 58.2 Normal -0.4 1.137 g/cm2 - - DualFemur Neck Left 09/22/2019 58.2 Normal -0.2 1.006 g/cm2 - - Right Forearm Radius 33% 09[age]22/2019 58.2 Normal 0.3 0.902 g/cm2 - - ASSESSMENT: The BMD measured at AP Spine L1-L4 (L2,L3) is 1.137 g/cm2 with a T-score of -0.4. This patient is considered normal according to [facility]) criteria. L-2 & L-3 were degenerative changes. The scan quality is good. World Pharmacologist [facility]) criteria for post-menopausal, Caucasian Women: Normal:       T-score at or above -1 SD Osteopenia:   T-score between -1 and -2.5 SD Osteoporosis: T-score at or below -2.5 SD RECOMMENDATIONS: 1. All patients should optimize calcium and vitamin D intake. 2. Consider FDA-approved medical therapies in postmenopausal women and men aged 87 years and older, based on the following: a. A hip or vertebral(clinical or morphometric) fracture b. T-score < -2.5 at the femoral neck or spine after appropriate evaluation to exclude secondary causes c. Low bone mass (T-score between -1.0 and -2.5 at the femoral neck or spine) and a 10-year probability of a hip fracture > 3% or a 10-year probability of a major osteoporosis-related fracture > 20% based on the [place]-adapted WHO algorithm d. Clinician judgment and/or patient preferences may indicate  treatment for people with 10-year fracture probabilities above or below these levels FOLLOW-UP: People with diagnosed cases of osteoporosis or at high risk for fracture should have regular bone mineral density tests. For patients eligible for Medicare, routine testing is allowed once every 2 years. The testing frequency can be increased to one year for patients who have rapidly progressing disease, those who are receiving or discontinuing medical therapy to restore bone mass, or have additional risk factors. I have reviewed this report, and agree with the above  findings. [facility], Electronically Signed   By: Lowella Grip III M.D.   On: 09/22/2019 11:39    ASSESSMENT & PLAN:   Carcinoma of upper-inner quadrant of right breast in female, estrogen receptor positive ([place]) #  Stage I ER PR positive HER-2 negative breast cancer s/p Lumpectomy & SLNBx.  Given G-1/ no LVI; no Oncotype testing. S/p RT  finished Jan 22nd 2021].  #Proceed with adjuvant aromatase inhibitor-goal of treatment is cure..  Again reviewed the potential side effects and mechanism of action. #Discussed the mechanism of action of aromatase inhibitors-with blocking of estrogen to prevent breast cancer.  Also discussed the potential side effects including but not limited to arthralgias hot flashes and increased risk of osteoporosis. [See below]  # Radiation dermatitis- G-1 s/p Silvadene improved.  # JAN 2021 BMD- T score+ 0.-4; Normal-continue calcium plus vitamin D.  Will reevaluate in 2 years.  # survivorship- # SURVIVORSHIP- I introduced to the patient the philosophy & goals of cancer survivorship program.  I also discussed the available resources and support through the cancer survivorship program our center.  Patient  is interested. We will make a referral.   # DISPOSITION:  # 6 weeks- MD- mychart Video visit; no labs;  Dr.B   All questions were answered. The patient/family knows to call the clinic with any  problems, questions or concerns.    Cammie Sickle, MD 09/27/2019 1:43 PM

## 2019-09-27 NOTE — Telephone Encounter (Signed)
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  Left my information and asked her to have pt call me back to discuss SCP visit.  Waiting for return call.

## 2019-09-27 NOTE — Assessment & Plan Note (Addendum)
#  Stage I ER PR positive HER-2 negative breast cancer s/p Lumpectomy & SLNBx.  Given G-1/ no LVI; no Oncotype testing. S/p RT  finished Jan 22nd 2021].  #Proceed with adjuvant aromatase inhibitor-goal of treatment is cure..  Again reviewed the potential side effects and mechanism of action. #Discussed the mechanism of action of aromatase inhibitors-with blocking of estrogen to prevent breast cancer.  Also discussed the potential side effects including but not limited to arthralgias hot flashes and increased risk of osteoporosis. [See below]  # Radiation dermatitis- G-1 s/p Silvadene improved.  # JAN 2021 BMD- T score+ 0.-4; Normal-continue calcium plus vitamin D.  Will reevaluate in 2 years.  # survivorship- # SURVIVORSHIP- I introduced to the patient the philosophy & goals of cancer survivorship program.  I also discussed the available resources and support through the cancer survivorship program our center.  Patient  is interested. We will make a referral.   # DISPOSITION:  # 6 weeks- MD- mychart Video visit; no labs;  Dr.B

## 2019-09-28 ENCOUNTER — Telehealth: Payer: Self-pay

## 2019-09-28 DIAGNOSIS — C50211 Malignant neoplasm of upper-inner quadrant of right female breast: Secondary | ICD-10-CM

## 2019-09-28 NOTE — Telephone Encounter (Signed)
Survivorship Care Plan visit completed.  Treatment summary reviewed and mailed to patient.  ASCO answers booklet reviewed and mailed to patient.  CARE program and Cancer Transitions discussed with patient along with other resources cancer center offers to patients and caregivers.  Patient verbalized understanding.  Informed patient APP will call her in 2 weeks to review SCP packet and discuss post cancer care.  Pt request telephone call and morning appointments due to working night shift.  Packet mailed.

## 2019-10-12 ENCOUNTER — Inpatient Hospital Stay (HOSPITAL_BASED_OUTPATIENT_CLINIC_OR_DEPARTMENT_OTHER): Payer: 59 | Admitting: Oncology

## 2019-10-12 DIAGNOSIS — C50211 Malignant neoplasm of upper-inner quadrant of right female breast: Secondary | ICD-10-CM | POA: Diagnosis not present

## 2019-10-12 DIAGNOSIS — Z170 Estrogen receptor positive status [ER+]: Secondary | ICD-10-CM | POA: Diagnosis not present

## 2019-10-12 NOTE — Progress Notes (Addendum)
CLINIC:  Survivorship   REASON FOR VISIT:  Routine follow-up post-treatment for a recent history of breast cancer  I connected with [name] on 10/29/19 at 10:00 AM EST by telephone visit and verified that I am speaking with the correct person using two identifiers.   I discussed the limitations, risks, security and privacy concerns of performing an evaluation and management service by telemedicine and the availability of in-person appointments. I also discussed with the patient that there may be a patient responsible charge related to this service. The patient expressed understanding and agreed to proceed.   Other persons participating in the visit and their role in the encounter: None  Patient's location: Home Provider's location: Office  BRIEF ONCOLOGIC HISTORY:  Oncology History Overview Note  # OCT 2020-RIGHT BREAST, UPPER INNER QUADRANT;  INVASIVE MAMMARY CARCINOMA, NO SPECIAL TYPE. S/p Lumpectomy & SLNBx [Dr.Cintron]pT1b pN0; G-1; ER/PR >90%; her 2neu-NEG;   # feb 1st 2021- Arimidex  # survivorship: pending; LMP- 2011/ No TAH or BSO  DIAGNOSIS: Right breast cancer  STAGE:    1     ;  GOALS: Cure  CURRENT/MOST RECENT THERAPY : anastrazole    Carcinoma of upper-inner quadrant of right breast in female, estrogen receptor positive ([place])  06/07/2019 Initial Diagnosis   Carcinoma of upper-inner quadrant of right breast in female, estrogen receptor positive ([place])     INTERVAL HISTORY:  Ms. Labreck presents to the [facility] today for our initial meeting to review her survivorship care plan detailing her treatment course for breast cancer, as well as monitoring long-term side effects of that treatment, education regarding health maintenance, screening, and overall wellness and health promotion.     Overall, Ms. Churchman reports feeling quite well since completing radiation (08/31/19-09/21/19).  She is status post lumpectomy with Dr Peyton Najjar on  06/11/2019. Currently on Arimidex.   REVIEW OF SYSTEMS:  Review of Systems  Constitutional: Positive for fatigue. Negative for appetite change, fever and unexpected weight change.  HENT:   Negative for nosebleeds, sore throat and trouble swallowing.   Eyes: Negative.   Respiratory: Negative.  Negative for cough, shortness of breath and wheezing.   Cardiovascular: Negative.  Negative for chest pain and leg swelling.  Gastrointestinal: Negative for abdominal pain, blood in stool, constipation, diarrhea, nausea and vomiting.  Endocrine: Negative.   Genitourinary: Negative.  Negative for bladder incontinence, hematuria and nocturia.   Musculoskeletal: Negative.  Negative for back pain and flank pain.  Skin: Positive for rash.  Neurological: Negative.  Negative for dizziness, headaches, light-headedness and numbness.  Hematological: Negative.   Psychiatric/Behavioral: Negative.  Negative for confusion. The patient is not nervous/anxious.    ONCOLOGY TREATMENT TEAM:  1. Surgeon:  Dr. Peyton Najjar 2. Medical Oncologist: Dr. Rogue Bussing 3. Radiation Oncologist: Dr. Baruch Gouty   PAST MEDICAL/SURGICAL HISTORY:  Past Medical History:  Diagnosis Date  . Breast cancer ([place])   . Carcinoma of upper-inner quadrant of right breast in female, estrogen receptor positive ([place]) 06/07/2019  . Dyspnea   . Family history of adverse reaction to anesthesia    nausea and vomiting  . GERD (gastroesophageal reflux disease)   . Headache   . History of kidney stones   . Hypertension   . Pneumonia    Past Surgical History:  Procedure Laterality Date  . APPENDECTOMY    . BREAST BIOPSY Right 2016   neg/ bx -clip  . BREAST BIOPSY Right 05/28/2019   x clip, stereo bx, invasive mammary carcinoma   . BREAST  LUMPECTOMY Right 06/11/2019  . LITHOTRIPSY    . PARTIAL MASTECTOMY WITH NEEDLE LOCALIZATION AND AXILLARY SENTINEL LYMPH NODE BX Right 06/11/2019   Procedure: PARTIAL MASTECTOMY WITH NEEDLE LOCALIZATION AND  AXILLARY SENTINEL LYMPH NODE BX;  Surgeon: Herbert Pun, MD;  Location: [facility] ORS;  Service: General;  Laterality: Right;  . TUBAL LIGATION    . WRIST FRACTURE SURGERY     ALLERGIES:  No Known Allergies   CURRENT MEDICATIONS:  Outpatient Encounter Medications as of 10/12/2019  Medication Sig  . acetaminophen (TYLENOL) 500 MG tablet Take 1,000 mg by mouth every 6 (six) hours as needed for moderate pain.  Marland Kitchen anastrozole (ARIMIDEX) 1 MG tablet Take 1 tablet (1 mg total) by mouth daily.  Marland Kitchen aspirin EC 81 MG tablet Take 81 mg by mouth daily.  . Calcium Carb-Cholecalciferol (CALCIUM 600+D) 600-800 MG-UNIT TABS Take 1 tablet by mouth daily.  . meloxicam (MOBIC) 15 MG tablet Take 15 mg by mouth daily.  . Multiple Vitamins-Minerals (CENTRUM SILVER ADULT 50+ PO) Take 1 tablet by mouth daily.  . silver sulfADIAZINE (SILVADENE) 1 % cream Apply 1 application topically 2 (two) times daily.  . valsartan-hydrochlorothiazide (DIOVAN-HCT) 160-12.5 MG tablet Take 1 tablet by mouth daily.   No facility-administered encounter medications on file as of 10/12/2019.     ONCOLOGIC FAMILY HISTORY:  Family History  Problem Relation Age of Onset  . Breast cancer Maternal Aunt 64  . Diabetes Mother      GENETIC COUNSELING/TESTING: None  SOCIAL HISTORY:  Joselle Deeds lives in [place].  She is currently employed and works for [employer] full time.  She denies any current tobacco use, alcohol or illicit drug use.  PHYSICAL EXAMINATION:  Vital Signs:  There were no vitals filed for this visit. There were no vitals filed for this visit. Limited d/t telephone visit  LABORATORY DATA:  Lab Results  Component Value Date   WBC 7.0 09/08/2019   HGB 12.9 09/08/2019   HCT 40.3 09/08/2019   MCV 92.4 09/08/2019   PLT 257 09/08/2019    DIAGNOSTIC IMAGING:  Last imaging was at diagnosis 05/04/2019 BI-RADS CATEGORY  0: Incomplete. Need additional imaging evaluation and/or prior  mammograms for comparison.  05/14/2019 RECOMMENDATION: Stereotactic tomosynthesis core needle biopsy of the focal asymmetry or mass involving the RIGHT breast  06/11/2019 IMPRESSION: Specimen radiograph of the right breast.  09/22/2019 Bone scan  RECOMMENDATIONS: 1. All patients should optimize calcium and vitamin D intake. 2. Consider FDA-approved medical therapies in postmenopausal women and men aged 59 years and older, based on the following: a. A hip or vertebral(clinical or morphometric) fracture b. T-score < -2.5 at the femoral neck or spine after appropriate evaluation to exclude secondary causes c. Low bone mass (T-score between -1.0 and -2.5 at the femoral neck or spine) and a 10-year probability of a hip fracture > 3% or a 10-year probability of a major osteoporosis-related fracture > 20% based on the [place]-adapted WHO algorithm d. Clinician judgment and/or patient preferences may indicate treatment for people with 10-year fracture probabilities above or below these levels FOLLOW-UP: People with diagnosed cases of osteoporosis or at high risk for fracture should have regular bone mineral density tests. For patients eligible for Medicare, routine testing is allowed once every 2 years. The testing frequency can be increased to one year for patients who have rapidly progressing disease, those who are receiving or discontinuing medical therapy to restore bone mass, or have additional risk factors.   ASSESSMENT AND PLAN:  Ms.. Trager is a pleasant 59 y.o. female with stage I invasive mammary carcinoma no special type diagnosed in October 2020.  She is status post lumpectomy and sentinel lymph node biopsy by Dr. Peyton Najjar ER/PR positive HER-2/neu negative.  She was treated with lumpectomy, adjuvant radiation therapy and antiestrogen therapy with Arimidex which she began on September 27, 2019. She presents to the Survivorship Clinic for our initial meeting and routine follow-up  post-completion of treatment for breast cancer.    1. Stage 1 right breast cancer-:  Ms. Shur is continuing to recover from definitive treatment for breast cancer. She will follow-up with her medical oncologist, Dr. Rogue Bussing in March 2021 with history and physical exam per surveillance protocol. She will continue annual mammograms as scheduled. Today, a comprehensive survivorship care plan and treatment summary was reviewed with the patient today detailing her breast cancer diagnosis, treatment course, potential late/long-term effects of treatment, appropriate follow-up care with recommendations for the future, and patient education resources.  A copy of this summary, along with a letter will be sent to the patient's primary care provider via mail/fax/In Basket message after today's visit.    Based on her history of hormone positivity breast cancer, she will continue her anti-estrogen therapy with Arimidex. Thus far, she is tolerating the Arimidex well, with minimal side effects. She was instructed to make Dr. Yevette Edwards or myself aware if she begins to experience any worsening side effects of the medication and I could see her back in clinic to help manage those side effects, as needed.   Though the incidence is low, there is an associated risk of endometrial cancer with anti-estrogen therapies.  Ms. Dardis was encouraged to contact Dr. Rogue Bussing or myself with any vaginal bleeding while taking Arimidex. Other side effects of AI's were again reviewed with her as well.   #. Problem(s) at Visit: None  Mrs. Repetto stated on day 3 of taking her Arimidex she developed leg weakness and felt like "her legs were going to give out on her".  Symptoms resolved the following day and have not returned.  Likely unrelated to the medication but we will continue to monitor.  Right breast irritation: Improving and likely due to radiation.  She continues to apply Silvadene cream with complete resolution.   States "every day it is improving".  #. Bone health:  Given Ms. Snarski's age/history of breast cancer and her current treatment regimen including anti-estrogen therapy with Arimidex, she is at risk for bone demineralization.  Her last DEXA scan was on September 22, 2019, which showed a T score of -0.4 which is considered normal.  In the meantime, she was encouraged to increase her consumption of foods rich in calcium, as well as increase her weight-bearing activities.  She was given education on specific activities to promote bone health. Continue to encourage supplementation of Calcium 1250m and Vitamin D 800 iu.   #. Cancer screening:  Due to Ms. Krolikowski's history and her age, she should receive screening for skin cancers, colon cancer, and gynecologic cancers.  The information and recommendations are listed on the patient's comprehensive care plan/treatment summary and were reviewed in detail with the patient.    #. Health maintenance and wellness promotion: Ms. LLedeewas encouraged to consume 5-7 servings of fruits and vegetables per day.  I suggested she review "Take Control of Your Health and Reduce Your Cancer Risk" from the [place]  She was also encouraged to engage in moderate to vigorous exercise for 30 minutes per  day most days of the week.  We discussed our care transitions program which is designed for cancer survivors to help them become more physically fit after cancer treatments.  She was instructed to limit her alcohol consumption and continue to abstain from tobacco.   #. Support services/counseling: It is not uncommon for this period of the patient's cancer care trajectory to be one of many emotions and stressors.  Previously, patient discussed with Magdalene Patricia our cancer transitions program.  Basilia Jumbo will be in touch with her with regards to start dates should she be interested.  Ms. Regnier was encouraged to take advantage of our many other support services  programs, support groups, and/or counseling in coping with her new life as a cancer survivor after completing anti-cancer treatment.  She was offered support today through active listening and expressive supportive counseling.  She was given information regarding our available services and encouraged to contact me with any questions or for help enrolling in any of our support group/programs.   Per NCCN guidelines:  We recommend history and physical exam 1-4 times per year as clinically appropriate for 5 years then annually.  Genetic screenings as needed.  Education, monitor and refer for lymphedema management if applicable.  Mammogram every 12 months.  Screening for metastasis: In absence of clinical signs and symptoms suggestive of recurrent disease there is no indication for labs or imaging studies for screening for metastasis.  Assess and encourage adherence to endocrine therapy.  Bone mineral density test every 2 years.  Maintain an active lifestyle, healthy diet and limited alcohol intake.  Maintain a healthy body weight.  Lastly, coordination of care between PCP and specialist.  Personalized survivorship treatment plan including personalized treatment summary of possible long-term toxicities unclear follow-up recommendations as recommended.  Dispo:   -Return to cancer center in March for follow-up with Dr. Rogue Bussing. -Mammogram due in August 2021. - DEXA scan due in January 2023. -Return to cancer center in March for follow-up with Dr. Donella Stade as scheduled. -Follow up with surgery as indicated.  -She is welcome to return back to the Survivorship Clinic at any time; no additional follow-up needed at this time.  -Consider referral back to survivorship as a long-term survivor for continued surveillance  I provided 15 minutes of non face-to-face telephone visit time during this encounter, and > 50% was spent counseling as documented under my assessment & plan.  Rulon Abide, NP  AGNP-C [facility] [place]   Note: PRIMARY CARE PROVIDER Hortencia Pilar, [place] [phone]

## 2019-10-26 ENCOUNTER — Other Ambulatory Visit: Payer: Self-pay

## 2019-10-27 ENCOUNTER — Encounter (INDEPENDENT_AMBULATORY_CARE_PROVIDER_SITE_OTHER): Payer: Self-pay

## 2019-10-27 ENCOUNTER — Ambulatory Visit
Admission: RE | Admit: 2019-10-27 | Discharge: 2019-10-27 | Disposition: A | Discharge status: Home / Self Care | Payer: 59 | Source: Ambulatory Visit | Attending: Radiation Oncology | Admitting: Radiation Oncology

## 2019-10-27 ENCOUNTER — Encounter: Payer: Self-pay | Admitting: Radiation Oncology

## 2019-10-27 ENCOUNTER — Other Ambulatory Visit: Payer: Self-pay

## 2019-10-27 VITALS — BP 141/82 | HR 71 | Temp 97.9°F | Resp 16 | Wt 239.9 lb

## 2019-10-27 DIAGNOSIS — Z923 Personal history of irradiation: Secondary | ICD-10-CM | POA: Diagnosis not present

## 2019-10-27 DIAGNOSIS — C50211 Malignant neoplasm of upper-inner quadrant of right female breast: Secondary | ICD-10-CM | POA: Diagnosis present

## 2019-10-27 DIAGNOSIS — Z79811 Long term (current) use of aromatase inhibitors: Secondary | ICD-10-CM | POA: Insufficient documentation

## 2019-10-27 DIAGNOSIS — Z170 Estrogen receptor positive status [ER+]: Secondary | ICD-10-CM | POA: Diagnosis not present

## 2019-10-27 NOTE — Progress Notes (Signed)
Radiation Oncology Follow up Note  Name: Lisa Jennings   Date:   10/27/2019 MRN:  [id number] DOB: [birth year]    This 59 y.o. female presents to the clinic today for 1 month follow-up status post whole breast radiation to her right breast for stage I (T1b N0 M0) ER/PR positive HER-2 negative invasive mammary carcinoma.  REFERRING PROVIDER: Hortencia Pilar, MD  HPI: Patient is a 59 year old female now 1 month out having completed whole breast radiation to her right breast for stage I ER/PR positive invasive mammary carcinoma.  Seen today in routine follow-up she is doing well.  She specifically denies breast tenderness cough or bone pain..  She has been started on Arimidex is tolerating that well without side effect.  COMPLICATIONS OF TREATMENT: none  FOLLOW UP COMPLIANCE: keeps appointments   PHYSICAL EXAM:  BP (!) 141/82   Pulse 71   Temp 97.9 F (36.6 C) (Tympanic)   Resp 16   Wt 239 lb 14.4 oz (108.8 kg)   BMI 40.54 kg/m  Lungs are clear to A&P cardiac examination essentially unremarkable with regular rate and rhythm. No dominant mass or nodularity is noted in either breast in 2 positions examined. Incision is well-healed. No axillary or supraclavicular adenopathy is appreciated. Cosmetic result is excellent.  Well-developed well-nourished patient in NAD. HEENT reveals PERLA, EOMI, discs not visualized.  Oral cavity is clear. No oral mucosal lesions are identified. Neck is clear without evidence of cervical or supraclavicular adenopathy. Lungs are clear to A&P. Cardiac examination is essentially unremarkable with regular rate and rhythm without murmur rub or thrill. Abdomen is benign with no organomegaly or masses noted. Motor sensory and DTR levels are equal and symmetric in the upper and lower extremities. Cranial nerves II through XII are grossly intact. Proprioception is intact. No peripheral adenopathy or edema is identified. No motor or sensory levels are noted. Crude visual  fields are within normal range.  RADIOLOGY RESULTS: No current films to review  PLAN: Present time patient is doing well all her skin issues have resolved she has an excellent cosmetic result.  She is currently on Arimidex tolerating it well without side effect.  I have asked to see her back in 1 month for follow-up.  Patient is to call with any concerns.  I would like to take this opportunity to thank you for allowing me to participate in the care of your patient.Noreene Filbert, MD

## 2019-10-29 NOTE — Addendum Note (Signed)
Addended by: Faythe Casa E on: 10/29/2019 01:36 PM   Modules accepted: Level of Service

## 2019-11-08 ENCOUNTER — Inpatient Hospital Stay: Payer: 59 | Attending: Internal Medicine | Admitting: Internal Medicine

## 2019-11-08 DIAGNOSIS — C50211 Malignant neoplasm of upper-inner quadrant of right female breast: Secondary | ICD-10-CM

## 2019-11-08 DIAGNOSIS — Z170 Estrogen receptor positive status [ER+]: Secondary | ICD-10-CM

## 2019-11-08 NOTE — Progress Notes (Signed)
I connected with Lisa Jennings on 11/08/19 at  2:45 PM EDT by telephone visit and verified that I am speaking with the correct person using two identifiers.  I discussed the limitations, risks, security and privacy concerns of performing an evaluation and management service by telemedicine and the availability of in-person appointments. I also discussed with the patient that there may be a patient responsible charge related to this service. The patient expressed understanding and agreed to proceed.    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 S/p Lumpectomy & SLNBx [Dr.Cintron]pT1b pN0; G-1; ER/PR >90%; her 2neu-NEG;   # feb 1st 2021- Arimidex  # survivorship: pending; LMP- 2011/ No TAH or BSO  DIAGNOSIS: Right breast cancer  STAGE:    1     ;  GOALS: Cure  CURRENT/MOST RECENT THERAPY : anastrazole    Carcinoma of upper-inner quadrant of right breast in female, estrogen receptor positive ([place])  06/07/2019 Initial Diagnosis   Carcinoma of upper-inner quadrant of right breast in female, estrogen receptor positive ([place])      Chief Complaint: breast cancer   History of present illness:Lisa Jennings 59 y.o.  female with history of early stage breast cancer currently on Demadex is here for follow-up.  Patient admits to mild hot flashes.  Especially nighttime.  Complains of mild swelling and pain in her hand and feet.  However this is not getting any worse.  Denies any interruption of her lifestyle.  Complains of right breast swelling/comes and goes.  No significant pain or lump.  Noted  Observation/objective: None.  Assessment and plan: Carcinoma of upper-inner quadrant of right breast in female, estrogen receptor positive ([place]) #   Stage I ER PR positive HER-2 negative breast cancer s/p Lumpectomy & SLNBx.  Given G-1/ no LVI; no Oncotype testing. S/p RT  finished Jan 22nd 2021]. On Arimidex.  # Arthritis- sec AI- recommend tylenol prn.   # JAN 2021 BMD- T score+ 0.-4; Normal-continue calcium plus vitamin D. STABLE.   #Intermittent swelling/fullness of the right breast-likely secondary to surgery/radiation.  However, if it gets worse-recommend to inform us further evaluation would be needed in person.  # DISPOSITION:  # 3 month- MD-no labs;  Dr.B    Follow-up instructions:  I discussed the assessment and treatment plan with the patient.  The patient was provided an opportunity to ask questions and all were answered.  The patient agreed with the plan and demonstrated understanding of instructions.  The patient was advised to call back or seek an in person evaluation if the symptoms worsen or if the condition fails to improve as anticipated.  I provided 11 minutes of non face-to-face telephone visit time during this encounter, and > 50% was spent counseling as documented under my assessment & plan.   Dr. Charlaine Dalton [place] at [facility] 11/08/2019 3:12 PM

## 2019-11-08 NOTE — Assessment & Plan Note (Addendum)
#  Stage I ER PR positive HER-2 negative breast cancer s/p Lumpectomy & SLNBx.  Given G-1/ no LVI; no Oncotype testing. S/p RT  finished Jan 22nd 2021]. On Arimidex.  # Arthritis- sec AI- recommend tylenol prn.   # JAN 2021 BMD- T score+ 0.-4; Normal-continue calcium plus vitamin D. STABLE.   #Intermittent swelling/fullness of the right breast-likely secondary to surgery/radiation.  However, if it gets worse-recommend to inform us further evaluation would be needed in person.  # DISPOSITION:  # 3 month- MD-no labs;  Dr.B

## 2020-02-07 ENCOUNTER — Inpatient Hospital Stay: Payer: Self-pay | Admitting: Internal Medicine

## 2020-02-07 NOTE — Progress Notes (Unsigned)
I connected with Lisa Jennings on 02/07/20 at  3:00 PM EDT by {Blank single:19197::"video enabled telemedicine visit","telephone visit"} and verified that I am speaking with the correct person using two identifiers.  I discussed the limitations, risks, security and privacy concerns of performing an evaluation and management service by telemedicine and the availability of in-person appointments. I also discussed with the patient that there may be a patient responsible charge related to this service. The patient expressed understanding and agreed to proceed.    Other persons participating in the visit and their role in the encounter: RN/medical reconciliation Patient's location: *** Provider's location: Home  Oncology History Overview Note  # OCT 2020-RIGHT BREAST, UPPER INNER QUADRANT;  INVASIVE MAMMARY CARCINOMA, NO SPECIAL TYPE. S/p Lumpectomy & SLNBx [Dr.Cintron]pT1b pN0; G-1; ER/PR >90%; her 2neu-NEG;   # feb 1st 2021- Arimidex  # survivorship: pending; LMP- 2011/ No TAH or BSO  DIAGNOSIS: Right breast cancer  STAGE:    1     ;  GOALS: Cure  CURRENT/MOST RECENT THERAPY : anastrazole    Carcinoma of upper-inner quadrant of right breast in female, estrogen receptor positive ([place])  06/07/2019 Initial Diagnosis   Carcinoma of upper-inner quadrant of right breast in female, estrogen receptor positive ([place])      Chief Complaint: ***    History of present illness:Lisa Jennings 59 y.o.  female with history of   Observation/objective:  Assessment and plan: No problem-specific Assessment & Plan notes found for this encounter.    Follow-up instructions:  I discussed the assessment and treatment plan with the patient.  The patient was provided an opportunity to ask questions and all were answered.  The patient agreed with the plan and demonstrated understanding of instructions.  The patient was advised to call back or seek an in person evaluation if the symptoms  worsen or if the condition fails to improve as anticipated.  I provided *** minutes of {Blank single:19197::"face-to-face video visit time","non face-to-face telephone visit time"} during this encounter, and > 50% was spent counseling as documented under my assessment & plan.   Dr. Charlaine Dalton [place] at [facility] 02/07/2020 12:10 PM

## 2020-02-07 NOTE — Assessment & Plan Note (Signed)
#  Stage I ER PR positive HER-2 negative breast cancer s/p Lumpectomy & SLNBx.  Given G-1/ no LVI; no Oncotype testing. S/p RT  finished Jan 22nd 2021]. On Arimidex.  # Arthritis- sec AI- recommend tylenol prn.   # JAN 2021 BMD- T score+ 0.-4; Normal-continue calcium plus vitamin D. STABLE.   #Intermittent swelling/fullness of the right breast-likely secondary to surgery/radiation.  However, if it gets worse-recommend to inform us further evaluation would be needed in person.  # DISPOSITION:  # 3 month- MD-no labs;  Dr.B  

## 2020-02-21 ENCOUNTER — Inpatient Hospital Stay: Payer: BC Managed Care – PPO | Attending: Internal Medicine | Admitting: Internal Medicine

## 2020-02-21 DIAGNOSIS — Z923 Personal history of irradiation: Secondary | ICD-10-CM | POA: Insufficient documentation

## 2020-02-21 DIAGNOSIS — Z170 Estrogen receptor positive status [ER+]: Secondary | ICD-10-CM | POA: Diagnosis not present

## 2020-02-21 DIAGNOSIS — Z79899 Other long term (current) drug therapy: Secondary | ICD-10-CM | POA: Insufficient documentation

## 2020-02-21 DIAGNOSIS — M1990 Unspecified osteoarthritis, unspecified site: Secondary | ICD-10-CM | POA: Diagnosis not present

## 2020-02-21 DIAGNOSIS — C50211 Malignant neoplasm of upper-inner quadrant of right female breast: Secondary | ICD-10-CM | POA: Diagnosis not present

## 2020-02-21 DIAGNOSIS — Z79811 Long term (current) use of aromatase inhibitors: Secondary | ICD-10-CM | POA: Insufficient documentation

## 2020-02-21 NOTE — Assessment & Plan Note (Addendum)
#  Stage I ER PR positive HER-2 negative breast cancer s/p Lumpectomy & SLNBx.  Given G-1/ no LVI; no Oncotype testing. S/p RT  finished Jan 22nd 2021]. On Arimidex. STABLE.   # Arthritis- sec AI- recommend tylenol prn; recommend vit D 2000 units; also osteobi-flex.    # JAN 2021 BMD- T score+ 0.-4; Normal-continue calcium plus vitamin D. STABLE.   # DISPOSITION: different insurance # 3 month- MD-no labs;  Dr.B

## 2020-02-21 NOTE — Progress Notes (Signed)
I connected with [name] on 02/21/2020 at  3:15 PM EDT by video enabled telemedicine visit and verified that I am speaking with the correct person using two identifiers.  I discussed the limitations, risks, security and privacy concerns of performing an evaluation and management service by telemedicine and the availability of in-person appointments. I also discussed with the patient that there may be a patient responsible charge related to this service. The patient expressed understanding and agreed to proceed.    Other persons participating in the visit and their role in the encounter: RN/medical reconciliation Patient's location: Home Provider's location: Office  Oncology History Overview Note  # OCT 2020-RIGHT BREAST, UPPER INNER QUADRANT;  INVASIVE MAMMARY CARCINOMA, NO SPECIAL TYPE. S/p Lumpectomy & SLNBx [Dr.Cintron]pT1b pN0; G-1; ER/PR >90%; her 2neu-NEG;   # feb 1st 2021- Arimidex  # survivorship: pending; LMP- 2011/ No TAH or BSO  DIAGNOSIS: Right breast cancer  STAGE:    1     ;  GOALS: Cure  CURRENT/MOST RECENT THERAPY : anastrazole    Carcinoma of upper-inner quadrant of right breast in female, estrogen receptor positive ([place])  06/07/2019 Initial Diagnosis   Carcinoma of upper-inner quadrant of right breast in female, estrogen receptor positive ([place])    Chief Complaint: breast cancer   History of present illness:Lisa Jennings 59 y.o.  female with history of breast cancer early stage currently on AI is here for follow-up.  Denies worsening hot flashes.  Denies any chest pain or shortness of breath or cough.  Joint pains not any worse.  Observation/objective:  Assessment and plan: Carcinoma of upper-inner quadrant of right breast in female, estrogen receptor positive ([place]) #  Stage I ER PR positive HER-2 negative breast cancer s/p Lumpectomy & SLNBx.  Given G-1/ no LVI; no Oncotype testing. S/p RT  finished Jan 22nd 2021]. On Arimidex. STABLE.    # Arthritis- sec AI- recommend tylenol prn; recommend vit D 2000 units; also osteobi-flex.    # JAN 2021 BMD- T score+ 0.-4; Normal-continue calcium plus vitamin D. STABLE.   # DISPOSITION: different insurance # 3 month- MD-no labs;  Dr.B     Follow-up instructions:  I discussed the assessment and treatment plan with the patient.  The patient was provided an opportunity to ask questions and all were answered.  The patient agreed with the plan and demonstrated understanding of instructions.  The patient was advised to call back or seek an in person evaluation if the symptoms worsen or if the condition fails to improve as anticipated.  Dr. Charlaine Dalton [place] at [facility] 02/27/2020 10:39 AM

## 2020-02-29 IMAGING — MG MM BREAST LOCALIZATION CLIP
4 series · 4 of 12 positions shown · non-contrast
Comparison: Previous exam(s).

CLINICAL DATA: Post stereotactic guided biopsy an
asymmetry/distortion within the slightly upper inner right breast.

EXAM:
DIAGNOSTIC RIGHT MAMMOGRAM POST STEREOTACTIC BIOPSY

[R CC synth-2D]
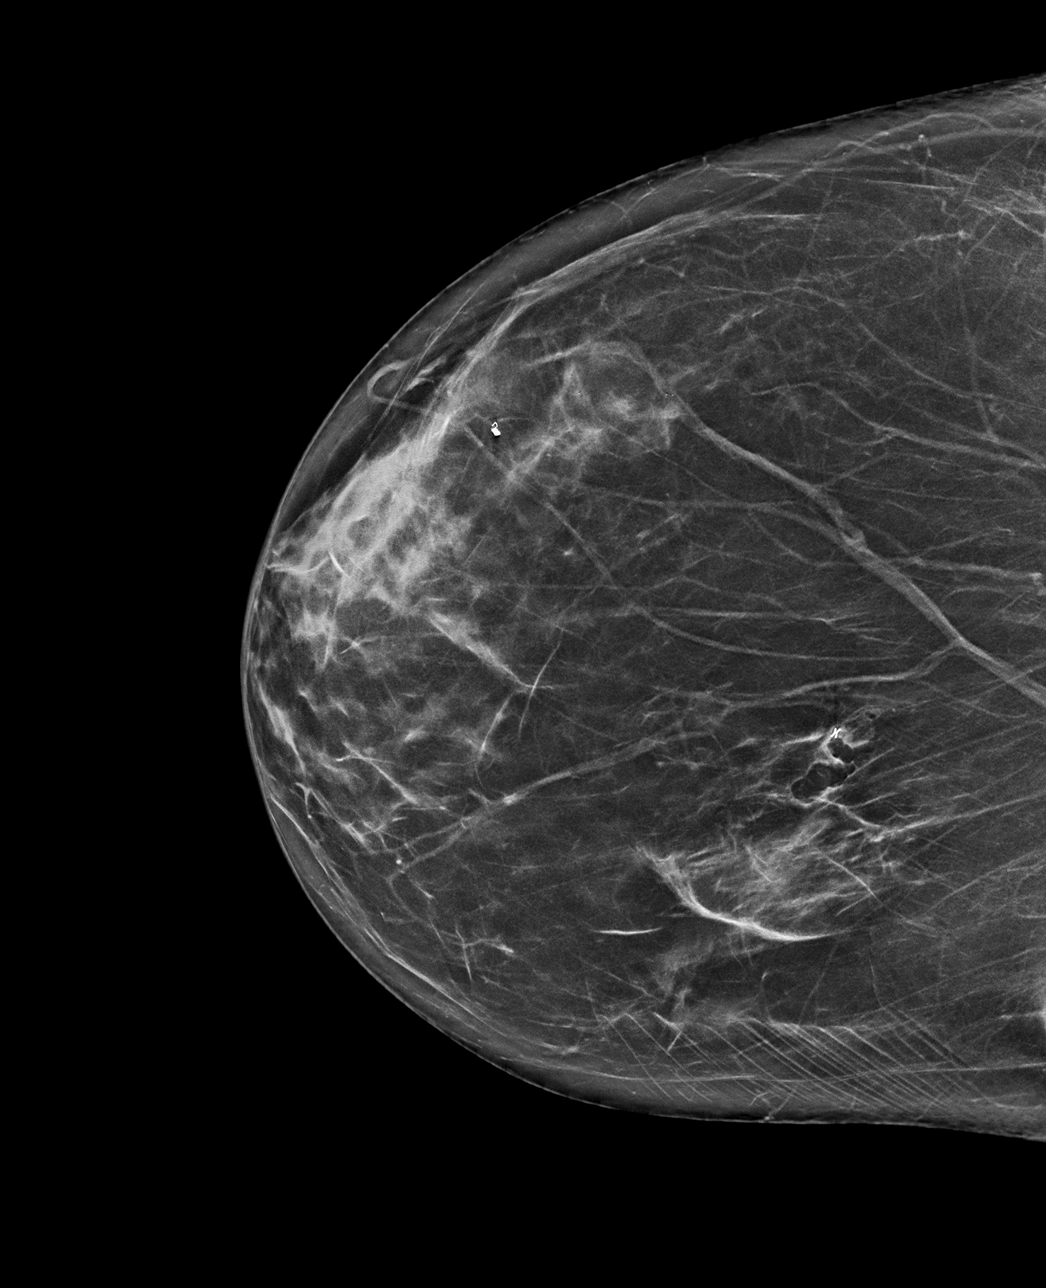

[R ML synth-2D]
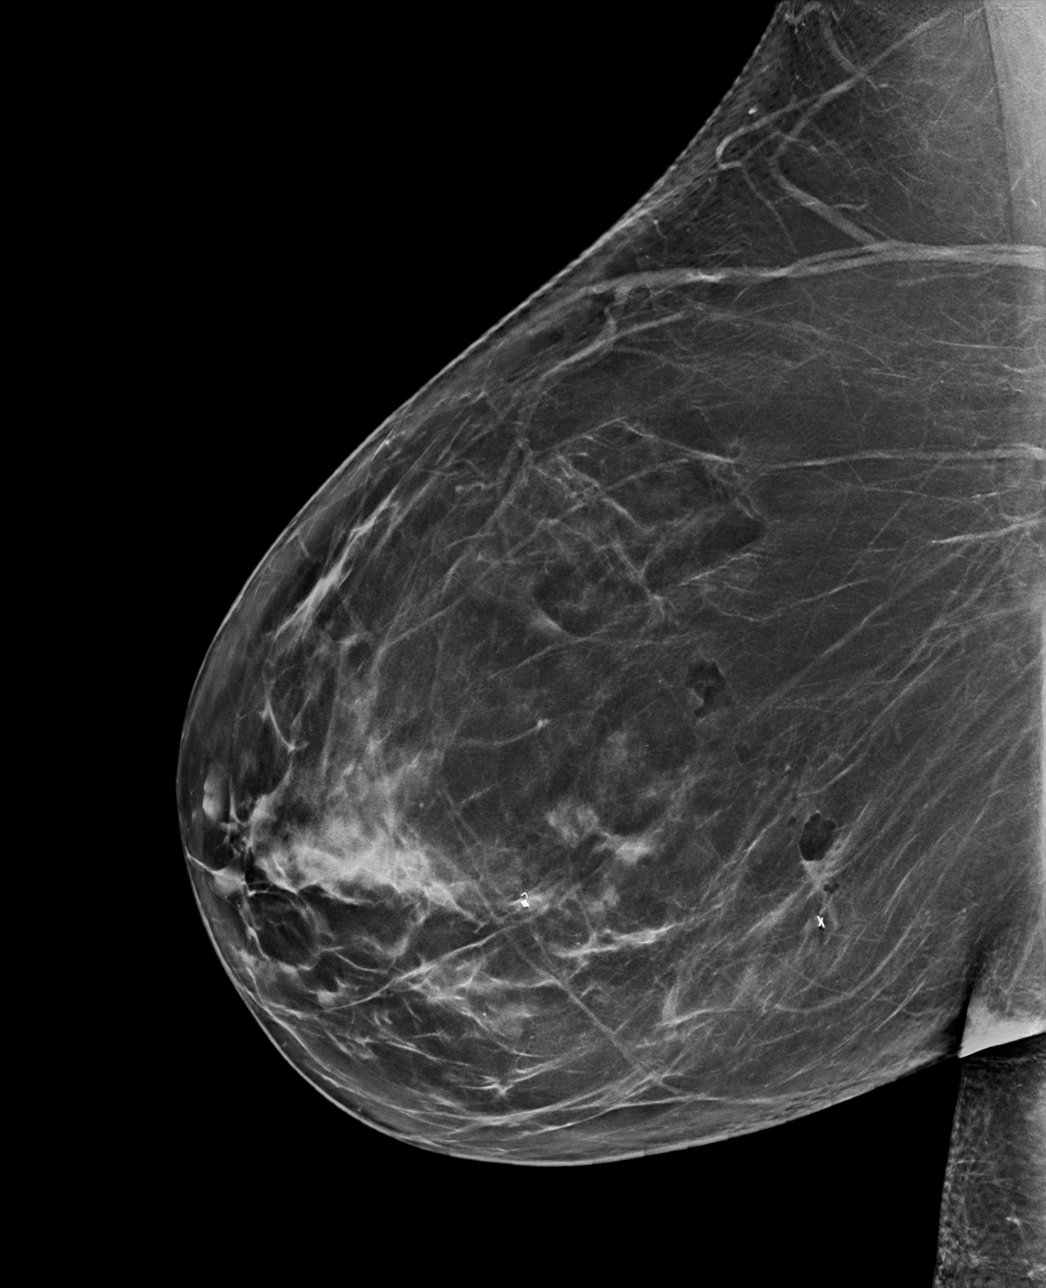

[R CC tomo · tomo slice 35/70.0]
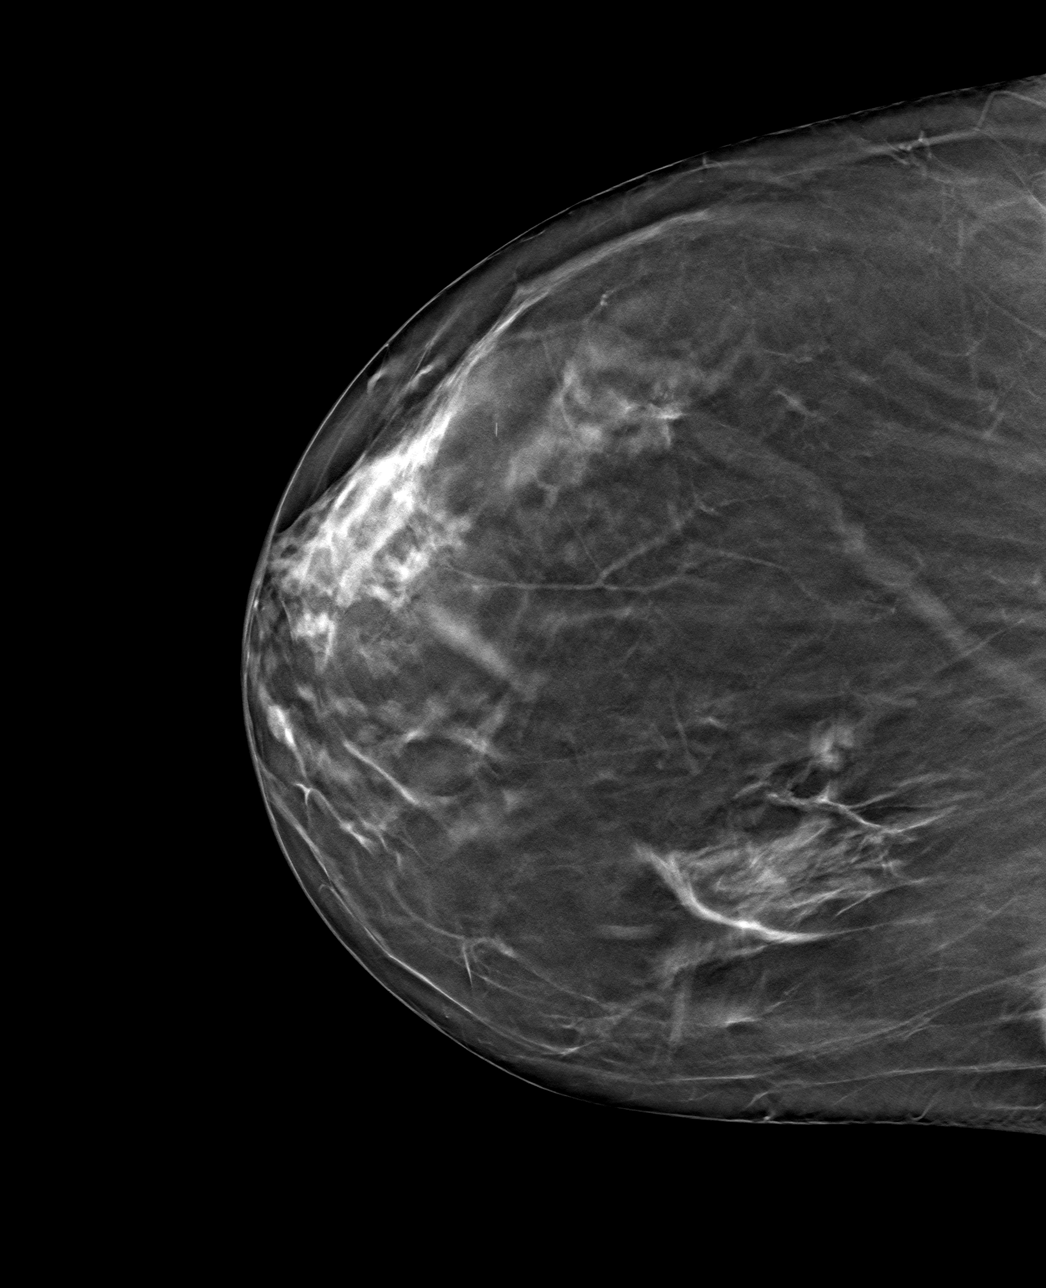

[R ML tomo · tomo slice 41/80.0]
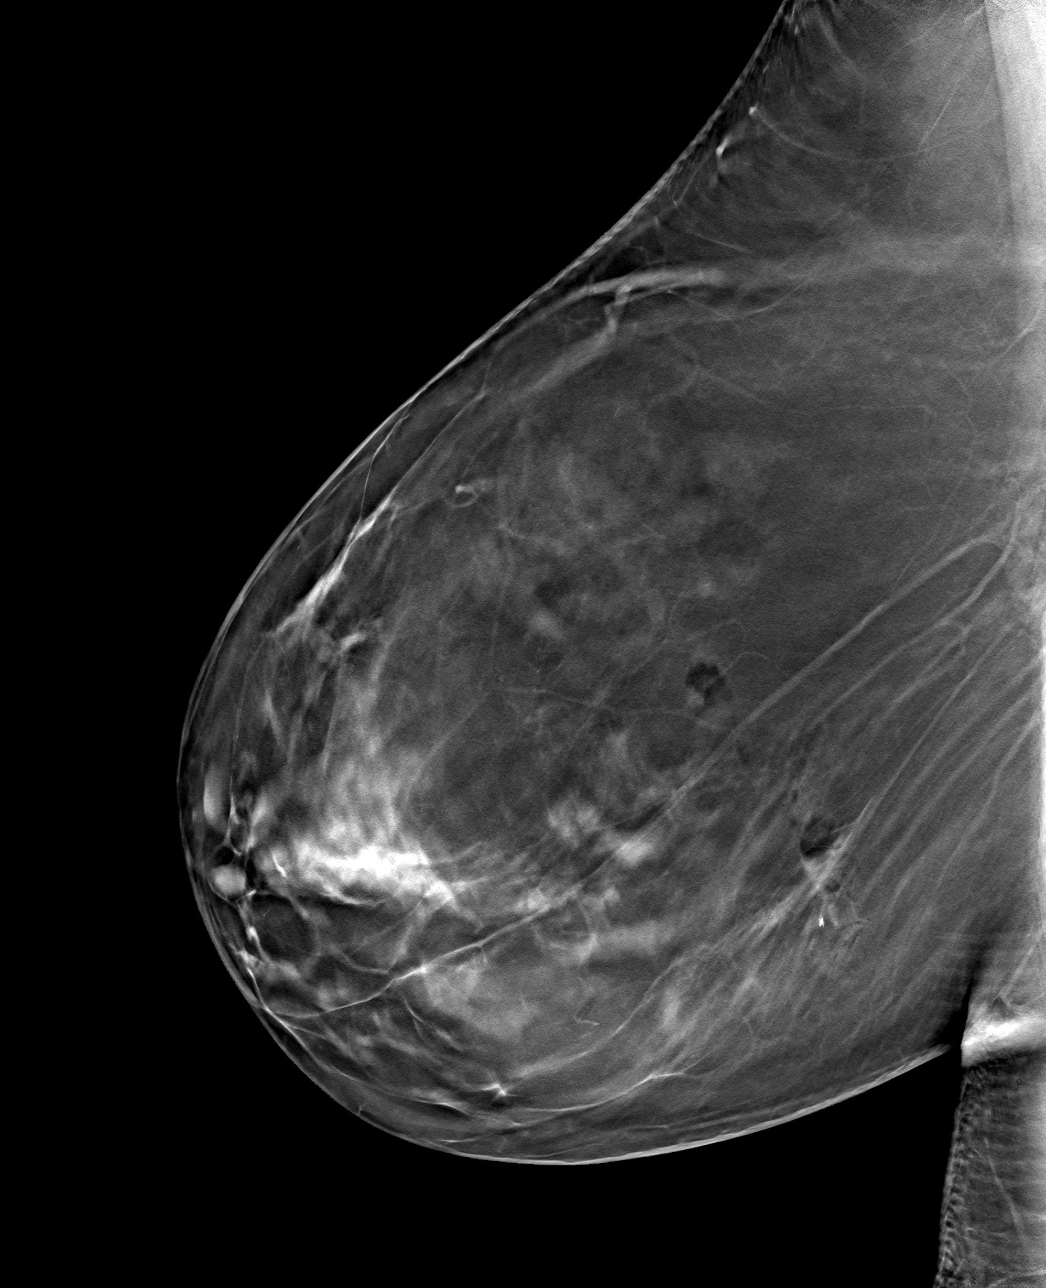

[4 of 12 positions shown; findings below may reference images not displayed]

FINDINGS: Mammographic images were obtained following stereotactic guided
biopsy of an asymmetry/distortion in the slightly upper inner right
breast. An X shaped biopsy marking clip is present and located
approximately 1 cm inferior to the biopsied asymmetry/distortion.
IMPRESSION: X shaped biopsy marking clip located approximately 1 cm inferior to
the biopsied asymmetry/distortion in the right breast.

Final Assessment: Post Procedure Mammograms for Marker Placement

## 2020-02-29 IMAGING — MG MM BREAST BX W/ LOC DEV 1ST LESION IMAGE BX SPEC STEREO GUIDE*R*
8 of 9 series · 8 of 17 positions shown · non-contrast
Comparison: Previous exams.
COMPARISON: Previous exams.

Addendum:
CLINICAL DATA: Suspicious focal asymmetry/distortion in the upper
inner right breast.

EXAM:
RIGHT BREAST STEREOTACTIC CORE NEEDLE BIOPSY

[R (1 of 6)]
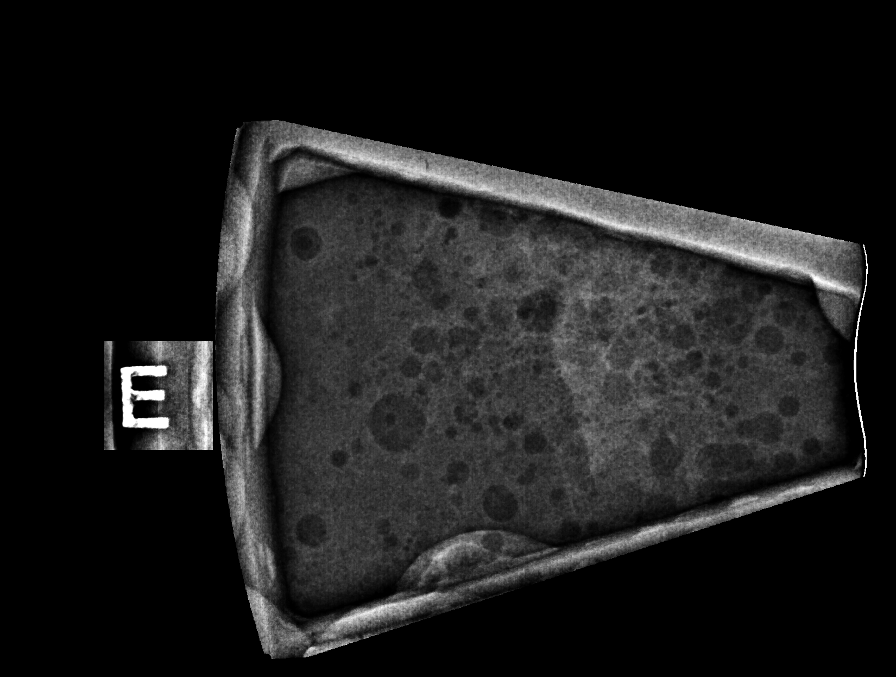

[R (2 of 6)]
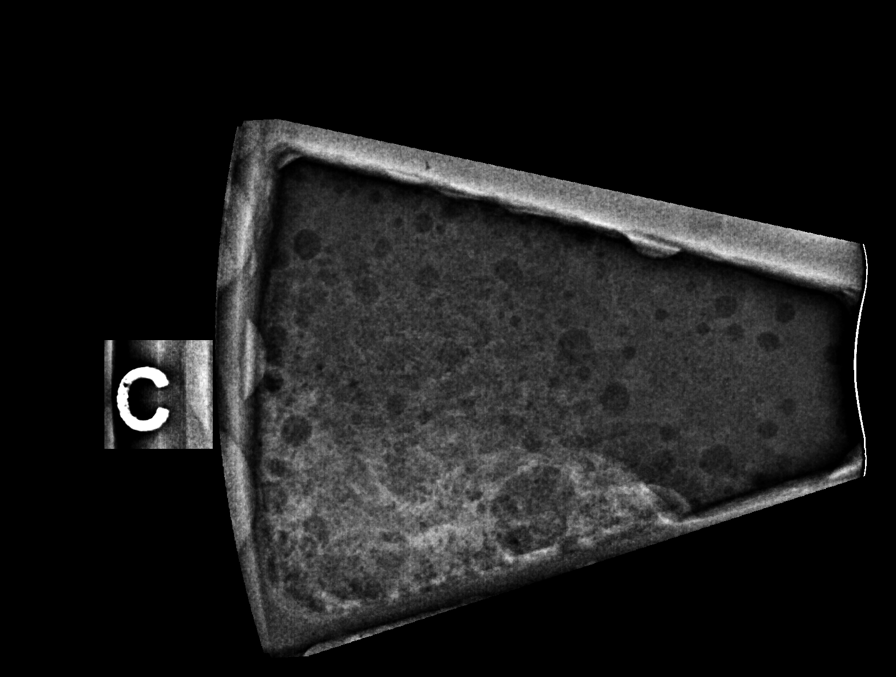

[R (3 of 6)]
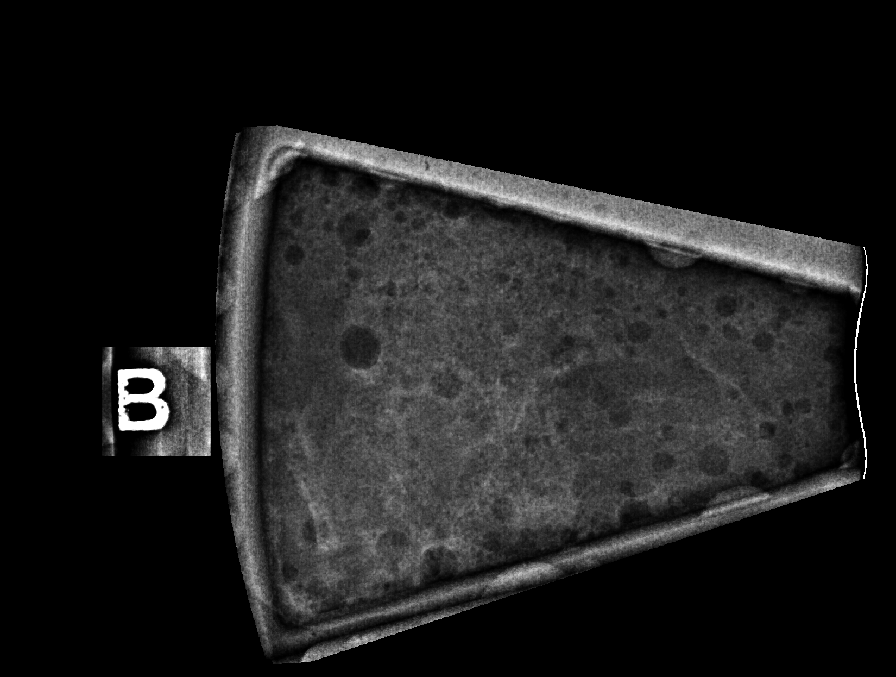

[R (4 of 6)]
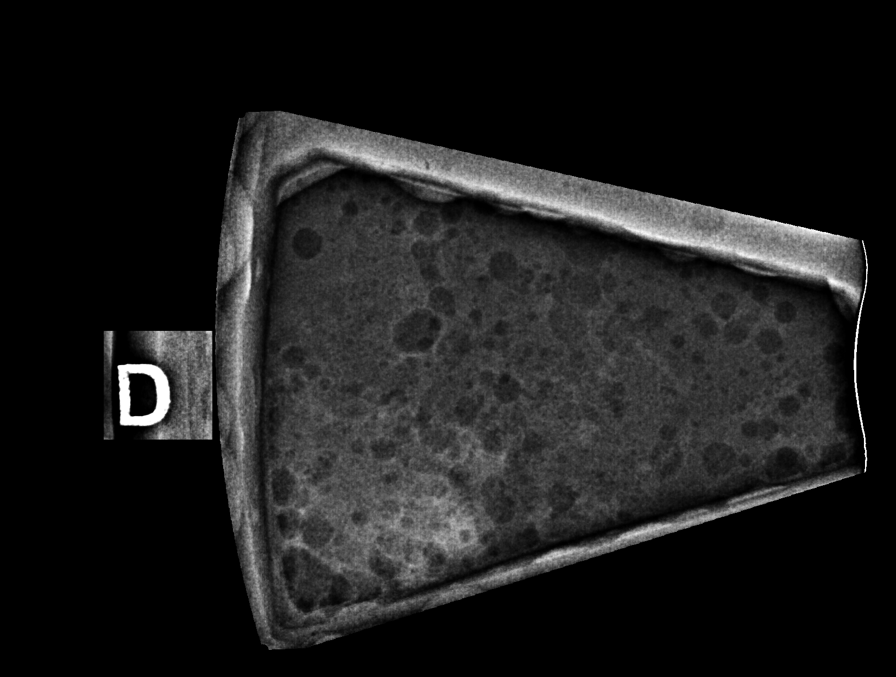

[R (5 of 6)]
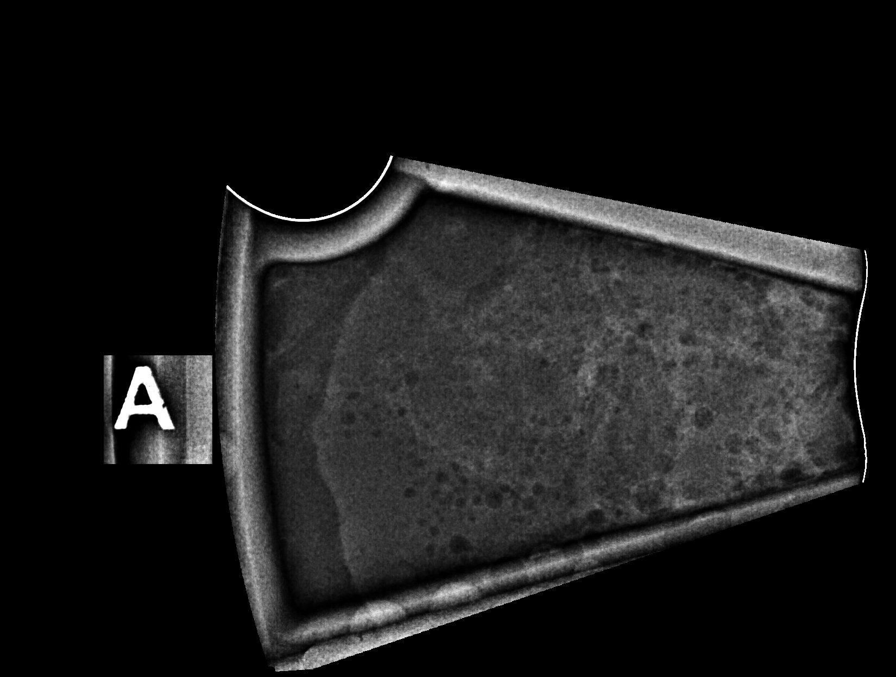

[R (6 of 6)]
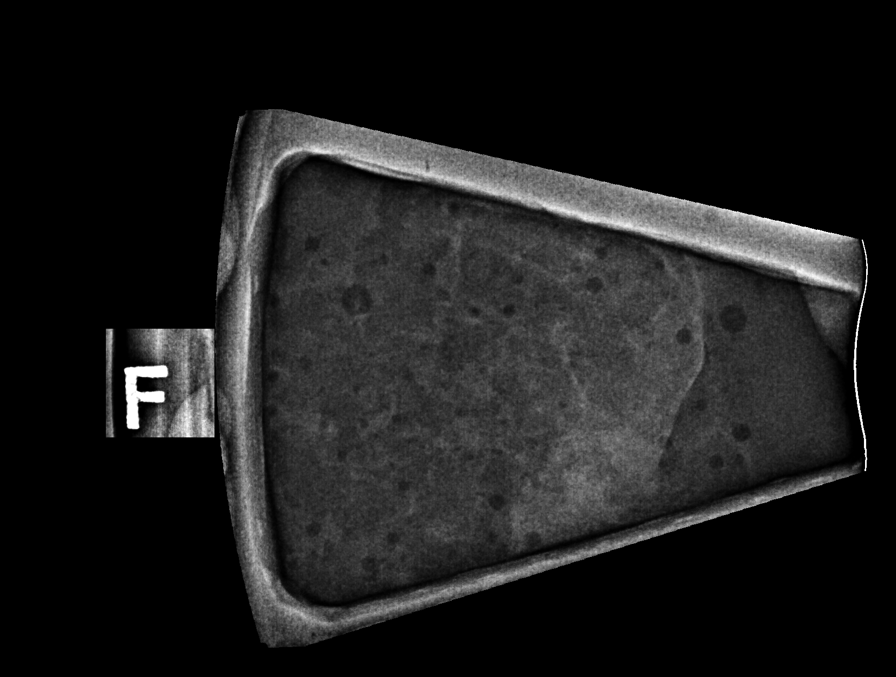

[R CC]
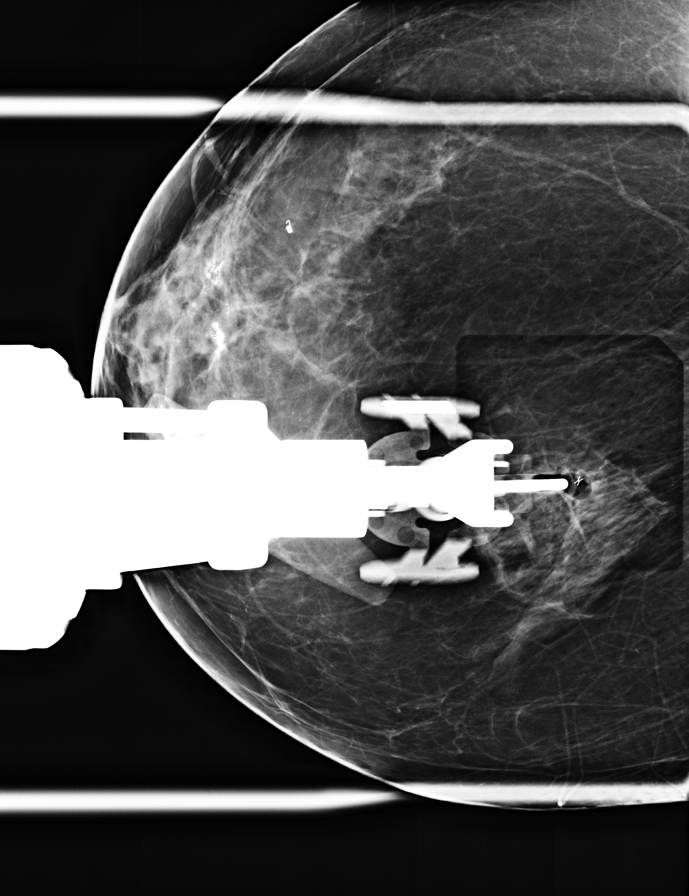

[R CC tomo · tomo slice 33/66.0]
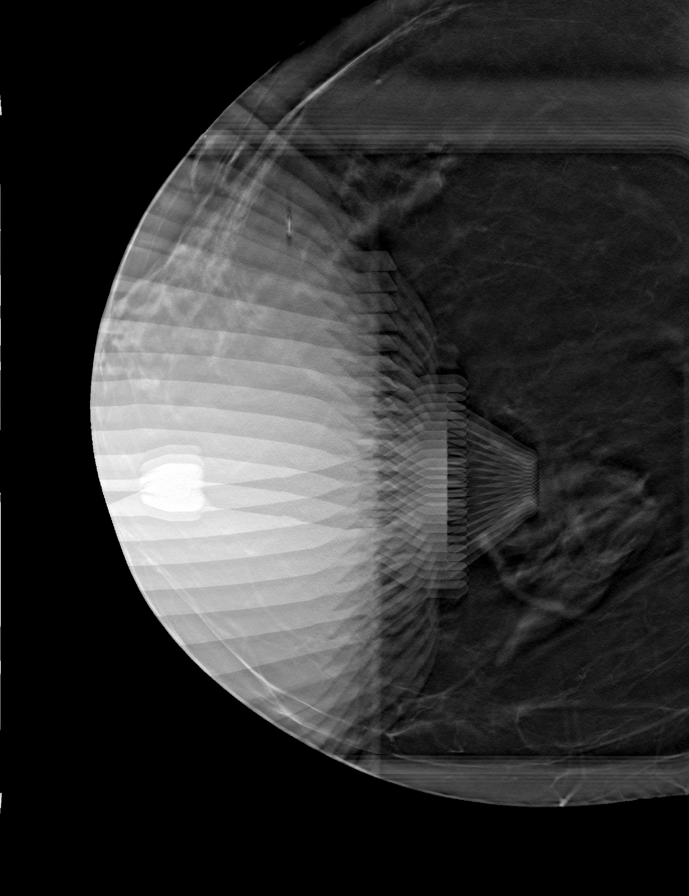

[8 of 17 positions shown; findings below may reference images not displayed]



Using sterile technique and 1% Lidocaine as local anesthetic, under
stereotactic guidance, a 9 gauge vacuum assisted device was used to
perform core needle biopsy of the focal asymmetry/distortion in the
upper inner right breast using a superior to inferior approach.

Lesion quadrant: Upper inner

At the conclusion of the procedure, an X shaped tissue marker clip
was deployed into the biopsy cavity. Follow-up 2-view mammogram was
performed and dictated separately.
IMPRESSION: Stereotactic-guided biopsy of the focal asymmetry/distortion in the
upper inner right breast. No apparent complications.

ADDENDUM:
PATHOLOGY revealed: A. RIGHT BREAST, UPPER INNER QUADRANT;
STEREOTACTIC-GUIDED CORE BIOPSY: - INVASIVE MAMMARY CARCINOMA, NO
SPECIAL TYPE. 5 mm in this sample. Grade 1. Ductal carcinoma in
situ: Not identified. Lymphovascular invasion: Not identified.

Pathology results are CONCORDANT with imaging findings, per Dr.
Qayomi Pachaa.

Pathology results were discussed with patient via telephone. The
patient reported doing well after the biopsy with tenderness at the
site. Post biopsy care instructions were reviewed and questions were
answered. The patient was encouraged to call [HOSPITAL]
for any additional concerns.

Recommendation: Surgical referral. Request for surgical referral was
relayed to nurse navigators at [HOSPITAL] [HOSPITAL] by
Angi Billiot RN on 05/31/2019.

Addendum by Angi Billiot RN on 06/01/2019.



Using sterile technique and 1% Lidocaine as local anesthetic, under
stereotactic guidance, a 9 gauge vacuum assisted device was used to
perform core needle biopsy of the focal asymmetry/distortion in the
upper inner right breast using a superior to inferior approach.

Lesion quadrant: Upper inner

At the conclusion of the procedure, an X shaped tissue marker clip
was deployed into the biopsy cavity. Follow-up 2-view mammogram was
performed and dictated separately.
IMPRESSION: Stereotactic-guided biopsy of the focal asymmetry/distortion in the
upper inner right breast. No apparent complications.

## 2020-03-28 ENCOUNTER — Other Ambulatory Visit: Payer: Self-pay

## 2020-03-28 ENCOUNTER — Encounter: Payer: Self-pay | Admitting: Radiation Oncology

## 2020-03-28 DIAGNOSIS — Z6838 Body mass index (BMI) 38.0-38.9, adult: Secondary | ICD-10-CM | POA: Insufficient documentation

## 2020-03-29 ENCOUNTER — Ambulatory Visit
Admission: RE | Admit: 2020-03-29 | Discharge: 2020-03-29 | Disposition: A | Discharge status: Home / Self Care | Payer: BC Managed Care – PPO | Source: Ambulatory Visit | Attending: Radiation Oncology | Admitting: Radiation Oncology

## 2020-03-29 DIAGNOSIS — Z923 Personal history of irradiation: Secondary | ICD-10-CM | POA: Diagnosis not present

## 2020-03-29 DIAGNOSIS — Z79811 Long term (current) use of aromatase inhibitors: Secondary | ICD-10-CM | POA: Diagnosis not present

## 2020-03-29 DIAGNOSIS — Z170 Estrogen receptor positive status [ER+]: Secondary | ICD-10-CM | POA: Diagnosis not present

## 2020-03-29 DIAGNOSIS — C50211 Malignant neoplasm of upper-inner quadrant of right female breast: Secondary | ICD-10-CM | POA: Insufficient documentation

## 2020-03-29 NOTE — Progress Notes (Signed)
Radiation Oncology Follow up Note  Name: Lisa Jennings   Date:   03/29/2020 MRN:  [id number] DOB: Sep 06, 1960    This 59 y.o. female presents to the clinic today for 59-monthfollow-up status post whole breast radiation to her right breast for stage I (T1b N0 M0) ER/PR positive HER-2 negative invasive mammary carcinoma..Marland Kitchen REFERRING PROVIDER: GHortencia Pilar MD  HPI: Patient is a 59year old female now out 6 months having completed whole breast radiation to her right breast for stage I ER/PR positive invasive mammary carcinoma seen today in routine follow-up she is doing well.  Still has occasional tenderness and some shooting pains in her right lateral breast consistent with possible recovery of nerve functions status post her wide local excision.  She specifically [place] cough bone pain.  She has not yet had a follow-up mammogram..  She is currently on Arimidex tolerating that well without side effect.  COMPLICATIONS OF TREATMENT: none  FOLLOW UP COMPLIANCE: keeps appointments   PHYSICAL EXAM:  BP (P) 137/71 (BP Location: Left Arm, Patient Position: Sitting)   Pulse (P) 88   Resp (P) 14   Wt (P) 238 lb 4.8 oz (108.1 kg)   BMI (P) 40.27 kg/m  Lungs are clear to A&P cardiac examination essentially unremarkable with regular rate and rhythm. No dominant mass or nodularity is noted in either breast in 2 positions examined. Incision is well-healed. No axillary or supraclavicular adenopathy is appreciated. Cosmetic result is excellent.  Still some slight hyperpigmentation of the right breast.  Well-developed well-nourished patient in NAD. HEENT reveals PERLA, EOMI, discs not visualized.  Oral cavity is clear. No oral mucosal lesions are identified. Neck is clear without evidence of cervical or supraclavicular adenopathy. Lungs are clear to A&P. Cardiac examination is essentially unremarkable with regular rate and rhythm without murmur rub or thrill. Abdomen is benign with no organomegaly or  masses noted. Motor sensory and DTR levels are equal and symmetric in the upper and lower extremities. Cranial nerves II through XII are grossly intact. Proprioception is intact. No peripheral adenopathy or edema is identified. No motor or sensory levels are noted. Crude visual fields are within normal range.  RADIOLOGY RESULTS: No current films to review  PLAN: Present time now 6 months out from whole breast radiation she is doing well.  And pleased with her overall progress.  I have asked to see her back in 1 year for follow-up.  She will have mammogram scheduled in October.  Patient continues on Arimidex without side effect.  Patient knows to call with any concerns.  I would like to take this opportunity to thank you for allowing me to participate in the care of your patient..Noreene Filbert MD

## 2020-04-25 DIAGNOSIS — M7661 Achilles tendinitis, right leg: Secondary | ICD-10-CM | POA: Insufficient documentation

## 2020-05-05 ENCOUNTER — Other Ambulatory Visit: Payer: Self-pay | Admitting: General Surgery

## 2020-05-05 DIAGNOSIS — Z853 Personal history of malignant neoplasm of breast: Secondary | ICD-10-CM

## 2020-05-22 ENCOUNTER — Inpatient Hospital Stay: Payer: BC Managed Care – PPO | Attending: Internal Medicine | Admitting: Internal Medicine

## 2020-05-22 ENCOUNTER — Other Ambulatory Visit: Payer: Self-pay

## 2020-05-22 ENCOUNTER — Encounter: Payer: Self-pay | Admitting: Internal Medicine

## 2020-05-22 DIAGNOSIS — Z803 Family history of malignant neoplasm of breast: Secondary | ICD-10-CM | POA: Insufficient documentation

## 2020-05-22 DIAGNOSIS — Z7982 Long term (current) use of aspirin: Secondary | ICD-10-CM | POA: Insufficient documentation

## 2020-05-22 DIAGNOSIS — Z791 Long term (current) use of non-steroidal anti-inflammatories (NSAID): Secondary | ICD-10-CM | POA: Diagnosis not present

## 2020-05-22 DIAGNOSIS — C50211 Malignant neoplasm of upper-inner quadrant of right female breast: Secondary | ICD-10-CM

## 2020-05-22 DIAGNOSIS — K219 Gastro-esophageal reflux disease without esophagitis: Secondary | ICD-10-CM | POA: Diagnosis not present

## 2020-05-22 DIAGNOSIS — Z833 Family history of diabetes mellitus: Secondary | ICD-10-CM | POA: Diagnosis not present

## 2020-05-22 DIAGNOSIS — Z79811 Long term (current) use of aromatase inhibitors: Secondary | ICD-10-CM | POA: Diagnosis not present

## 2020-05-22 DIAGNOSIS — Z170 Estrogen receptor positive status [ER+]: Secondary | ICD-10-CM | POA: Insufficient documentation

## 2020-05-22 DIAGNOSIS — Z79899 Other long term (current) drug therapy: Secondary | ICD-10-CM | POA: Diagnosis not present

## 2020-05-22 DIAGNOSIS — I10 Essential (primary) hypertension: Secondary | ICD-10-CM | POA: Insufficient documentation

## 2020-05-22 DIAGNOSIS — Z87442 Personal history of urinary calculi: Secondary | ICD-10-CM | POA: Insufficient documentation

## 2020-05-22 MED ORDER — ANASTROZOLE 1 MG PO TABS: 1.0000 mg | ORAL_TABLET | Freq: Every day | ORAL | 3 refills | Status: DC

## 2020-05-22 NOTE — Progress Notes (Signed)
[facility] [place] NOTE  Patient Care Team: Hortencia Pilar, MD as PCP - General (Family Medicine) Cammie Sickle, MD as Consulting Physician (Internal Medicine) Herbert Pun, MD as Consulting Physician (General Surgery) Noreene Filbert, MD as Referring Physician (Radiation Oncology)  CHIEF COMPLAINTS/PURPOSE OF CONSULTATION: Breast cancer  #  Oncology History Overview Note  # OCT 2020-RIGHT BREAST, UPPER INNER QUADRANT;  INVASIVE MAMMARY CARCINOMA, NO SPECIAL TYPE. S/p Lumpectomy & SLNBx [Dr.Cintron]pT1b pN0; G-1; ER/PR >90%; her 2neu-NEG;   # feb 1st 2021- Arimidex  # survivorship: pending; LMP- 2011/ No TAH or BSO  DIAGNOSIS: Right breast cancer  STAGE:    1     ;  GOALS: Cure  CURRENT/MOST RECENT THERAPY : anastrazole    Carcinoma of upper-inner quadrant of right breast in female, estrogen receptor positive ([place])  06/07/2019 Initial Diagnosis   Carcinoma of upper-inner quadrant of right breast in female, estrogen receptor positive ([place])      HISTORY OF PRESENTING ILLNESS:  Lisa Jennings 59 y.o.  female with above history of of right breast cancer stage I ER/PRpos; her2 NEG on arimidex is here for follow-up.  In the interim patient was evaluated by orthopedics for left knee pain.  She was told to have Achilles tendinitis affecting the knee.  Patient received steroid injection.  Pain improved.  She continues to be compliant with her Arimidex.  Denies any new lumps or bumps.  No nausea no vomiting.  No worsening joint pains otherwise.   Review of Systems  Constitutional: Negative for chills, diaphoresis, fever, malaise/fatigue and weight loss.  HENT: Negative for nosebleeds and sore throat.   Eyes: Negative for double vision.  Respiratory: Negative for cough, hemoptysis, sputum production, shortness of breath and wheezing.   Cardiovascular: Negative for chest pain, palpitations, orthopnea and leg swelling.  Gastrointestinal:  Negative for abdominal pain, blood in stool, constipation, diarrhea, heartburn, melena, nausea and vomiting.  Genitourinary: Negative for dysuria, frequency and urgency.  Musculoskeletal: Positive for back pain and joint pain.  Skin: Negative.  Negative for itching and rash.  Neurological: Negative for dizziness, tingling, focal weakness, weakness and headaches.  Endo/Heme/Allergies: Does not bruise/bleed easily.  Psychiatric/Behavioral: Negative for depression. The patient is not nervous/anxious and does not have insomnia.      MEDICAL HISTORY:  Past Medical History:  Diagnosis Date  . Breast cancer ([place])   . Carcinoma of upper-inner quadrant of right breast in female, estrogen receptor positive ([place]) 06/07/2019  . Dyspnea   . Family history of adverse reaction to anesthesia    nausea and vomiting  . GERD (gastroesophageal reflux disease)   . Headache   . History of kidney stones   . Hypertension   . Pneumonia     SURGICAL HISTORY: Past Surgical History:  Procedure Laterality Date  . APPENDECTOMY    . BREAST BIOPSY Right 2016   neg/ bx -clip  . BREAST BIOPSY Right 05/28/2019   x clip, stereo bx, invasive mammary carcinoma   . BREAST LUMPECTOMY Right 06/11/2019  . LITHOTRIPSY    . PARTIAL MASTECTOMY WITH NEEDLE LOCALIZATION AND AXILLARY SENTINEL LYMPH NODE BX Right 06/11/2019   Procedure: PARTIAL MASTECTOMY WITH NEEDLE LOCALIZATION AND AXILLARY SENTINEL LYMPH NODE BX;  Surgeon: Herbert Pun, MD;  Location: [facility] ORS;  Service: General;  Laterality: Right;  . TUBAL LIGATION    . WRIST FRACTURE SURGERY      SOCIAL HISTORY: Social History   Socioeconomic History  . Marital status: Divorced    Spouse  name: Not on file  . Number of children: Not on file  . Years of education: Not on file  . Highest education level: Not on file  Occupational History  . Not on file  Tobacco Use  . Smoking status: Never Smoker  . Smokeless tobacco: Never Used  Vaping Use  .  Vaping Use: Never used  Substance and Sexual Activity  . Alcohol use: Never  . Drug use: Never  . Sexual activity: Not Currently  Other Topics Concern  . Not on file  Social History Narrative   Lives in [place]/ with brother/ son& family. 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    Social Determinants of Health   Financial Resource Strain:   . Difficulty of Paying Living Expenses: Not on file  Food Insecurity:   . Worried About Charity fundraiser in the Last Year: Not on file  . Ran Out of Food in the Last Year: Not on file  Transportation Needs:   . Lack of Transportation (Medical): Not on file  . Lack of Transportation (Non-Medical): Not on file  Physical Activity:   . Days of Exercise per Week: Not on file  . Minutes of Exercise per Session: Not on file  Stress:   . Feeling of Stress : Not on file  Social Connections:   . Frequency of Communication with Friends and Family: Not on file  . Frequency of Social Gatherings with Friends and Family: Not on file  . Attends Religious Services: Not on file  . Active Member of Clubs or Organizations: Not on file  . Attends Archivist Meetings: Not on file  . Marital Status: Not on file  Intimate Partner Violence:   . Fear of Current or Ex-Partner: Not on file  . Emotionally Abused: Not on file  . Physically Abused: Not on file  . Sexually Abused: Not on file    FAMILY HISTORY: Family History  Problem Relation Age of Onset  . Breast cancer Maternal Aunt 64  . Diabetes Mother     ALLERGIES:  has No Known Allergies.  MEDICATIONS:  Current Outpatient Medications  Medication Sig Dispense Refill  . acetaminophen (TYLENOL) 500 MG tablet Take 1,000 mg by mouth every 6 (six) hours as needed for moderate pain.    Marland Kitchen anastrozole (ARIMIDEX) 1 MG tablet Take 1 tablet (1 mg total) by mouth daily. 90 tablet 3  . aspirin EC 81 MG tablet Take 81 mg by mouth daily.    . Calcium Carb-Cholecalciferol (CALCIUM 600+D)  600-800 MG-UNIT TABS Take 1 tablet by mouth daily.    . meloxicam (MOBIC) 15 MG tablet Take 15 mg by mouth daily.    . Multiple Vitamins-Minerals (CENTRUM SILVER ADULT 50+ PO) Take 1 tablet by mouth daily.    . valsartan-hydrochlorothiazide (DIOVAN-HCT) 160-12.5 MG tablet Take 1 tablet by mouth daily.     No current facility-administered medications for this visit.      Marland Kitchen  PHYSICAL EXAMINATION: ECOG PERFORMANCE STATUS: 0 - Asymptomatic  Vitals:   05/22/20 1503  BP: 127/64  Pulse: 68  Resp: 16  Temp: 98.1 F (36.7 C)  SpO2: 97%   Filed Weights   05/22/20 1503  Weight: 236 lb (107 kg)    Physical Exam HENT:     Head: Normocephalic and atraumatic.     Mouth/Throat:     Pharynx: No oropharyngeal exudate.  Eyes:     Pupils: Pupils are equal, round, and reactive to light.  Cardiovascular:  Rate and Rhythm: Normal rate and regular rhythm.  Pulmonary:     Effort: No respiratory distress.     Breath sounds: No wheezing.  Abdominal:     General: Bowel sounds are normal. There is no distension.     Palpations: Abdomen is soft. There is no mass.     Tenderness: There is no abdominal tenderness. There is no guarding or rebound.  Musculoskeletal:        General: No tenderness. Normal range of motion.     Cervical back: Normal range of motion and neck supple.  Skin:    General: Skin is warm.  Neurological:     Mental Status: She is alert and oriented to person, place, and time.  Psychiatric:        Mood and Affect: Affect normal.      LABORATORY DATA:  I have reviewed the data as listed Lab Results  Component Value Date   WBC 7.0 09/08/2019   HGB 12.9 09/08/2019   HCT 40.3 09/08/2019   MCV 92.4 09/08/2019   PLT 257 09/08/2019   No results for input(s): NA, K, CL, CO2, GLUCOSE, BUN, CREATININE, CALCIUM, GFRNONAA, GFRAA, PROT, ALBUMIN, AST, ALT, ALKPHOS, BILITOT, BILIDIR, IBILI in the last 8760 hours.  RADIOGRAPHIC STUDIES: I have personally reviewed the  radiological images as listed and agreed with the findings in the report. No results found.  ASSESSMENT & PLAN:   Carcinoma of upper-inner quadrant of right breast in female, estrogen receptor positive ([place]) #  Stage I ER PR positive HER-2 negative breast cancer- On Arimidex.STABLE; refilled arimidex. Awaiting Mammo in October 2021.   # Arthritis- sec AI- STABLE; recommend vit D 2000 units; also osteobi-flex.  Right knee pain/tendinitis unlikely secondary to anastrozole.  Monitor for now  # JAN 2021 BMD- T score+ 0.-4; Normal-continue calcium plus vitamin D. STABLE.   # DISPOSITION:  # 6 month- MD-no labs;  Dr.B   All questions were answered. The patient/family knows to call the clinic with any problems, questions or concerns.    Cammie Sickle, MD 05/22/2020 3:31 PM

## 2020-05-22 NOTE — Assessment & Plan Note (Addendum)
#  Stage I ER PR positive HER-2 negative breast cancer- On Arimidex.STABLE; refilled arimidex. Awaiting Mammo in October 2021.   # Arthritis- sec AI- STABLE; recommend vit D 2000 units; also osteobi-flex.  Right knee pain/tendinitis unlikely secondary to anastrozole.  Monitor for now  # JAN 2021 BMD- T score+ 0.-4; Normal-continue calcium plus vitamin D. STABLE.   # DISPOSITION:  # 6 month- MD-no labs;  Dr.B

## 2020-06-09 ENCOUNTER — Other Ambulatory Visit: Payer: Self-pay

## 2020-06-09 ENCOUNTER — Ambulatory Visit
Admission: RE | Admit: 2020-06-09 | Discharge: 2020-06-09 | Disposition: A | Discharge status: Home / Self Care | Payer: BC Managed Care – PPO | Source: Ambulatory Visit | Attending: General Surgery | Admitting: General Surgery

## 2020-06-09 DIAGNOSIS — Z853 Personal history of malignant neoplasm of breast: Secondary | ICD-10-CM | POA: Insufficient documentation

## 2020-09-05 ENCOUNTER — Encounter: Payer: Self-pay | Admitting: *Deleted

## 2020-10-04 DIAGNOSIS — G5762 Lesion of plantar nerve, left lower limb: Secondary | ICD-10-CM | POA: Insufficient documentation

## 2020-10-17 DIAGNOSIS — M654 Radial styloid tenosynovitis [de Quervain]: Secondary | ICD-10-CM | POA: Insufficient documentation

## 2020-11-20 ENCOUNTER — Other Ambulatory Visit: Payer: Self-pay

## 2020-11-20 ENCOUNTER — Inpatient Hospital Stay: Payer: BC Managed Care – PPO | Attending: Internal Medicine | Admitting: Internal Medicine

## 2020-11-20 ENCOUNTER — Encounter: Payer: Self-pay | Admitting: Internal Medicine

## 2020-11-20 DIAGNOSIS — Z7982 Long term (current) use of aspirin: Secondary | ICD-10-CM | POA: Insufficient documentation

## 2020-11-20 DIAGNOSIS — Z79899 Other long term (current) drug therapy: Secondary | ICD-10-CM | POA: Diagnosis not present

## 2020-11-20 DIAGNOSIS — Z170 Estrogen receptor positive status [ER+]: Secondary | ICD-10-CM | POA: Insufficient documentation

## 2020-11-20 DIAGNOSIS — Z79811 Long term (current) use of aromatase inhibitors: Secondary | ICD-10-CM | POA: Insufficient documentation

## 2020-11-20 DIAGNOSIS — M25561 Pain in right knee: Secondary | ICD-10-CM | POA: Diagnosis not present

## 2020-11-20 DIAGNOSIS — C50211 Malignant neoplasm of upper-inner quadrant of right female breast: Secondary | ICD-10-CM | POA: Diagnosis not present

## 2020-11-20 DIAGNOSIS — I10 Essential (primary) hypertension: Secondary | ICD-10-CM | POA: Diagnosis not present

## 2020-11-20 DIAGNOSIS — Z923 Personal history of irradiation: Secondary | ICD-10-CM | POA: Diagnosis not present

## 2020-11-20 NOTE — Assessment & Plan Note (Addendum)
#  Stage I ER PR positive HER-2 negative breast cancer- On Arimidex. STABLE  Mammo in October 2021-reviewed; within normal limits.  # Arthritis- sec AI- STABLE; recommend vit D 2000 units; also osteobi-flex. Right knee pain [Dr.Cole; [place]]   # JAN 2021 BMD- T score+ 0.-4; Normal-continue calcium plus vitamin D. STABLE.  # DISPOSITION:  # 6 month- MD-no labs;  Dr.B

## 2020-11-20 NOTE — Progress Notes (Signed)
[facility] [place] NOTE  Patient Care Team: Hortencia Pilar, MD as PCP - General (Family Medicine) Cammie Sickle, MD as Consulting Physician (Internal Medicine) Herbert Pun, MD as Consulting Physician (General Surgery) Noreene Filbert, MD as Referring Physician (Radiation Oncology)  CHIEF COMPLAINTS/PURPOSE OF CONSULTATION: Breast cancer  #  Oncology History Overview Note  # OCT 2020-RIGHT BREAST, UPPER INNER QUADRANT;  INVASIVE MAMMARY CARCINOMA, NO SPECIAL TYPE. S/p Lumpectomy & SLNBx [Dr.Cintron]pT1b pN0; G-1; ER/PR >90%; her 2neu-NEG;   # feb 1st 2021- Arimidex  # survivorship: pending; LMP- 2011/ No TAH or BSO  DIAGNOSIS: Right breast cancer  STAGE:    1     ;  GOALS: Cure  CURRENT/MOST RECENT THERAPY : anastrazole    Carcinoma of upper-inner quadrant of right breast in female, estrogen receptor positive ([place])  06/07/2019 Initial Diagnosis   Carcinoma of upper-inner quadrant of right breast in female, estrogen receptor positive ([place])      HISTORY OF PRESENTING ILLNESS:  Lisa Jennings 60 y.o.  female with above history of of right breast cancer stage I ER/PRpos; her2 NEG on arimidex is here for follow-up.  Patient still complaining of right knee pain.  She is currently being evaluated by orthopedic doctor interim.  She had received steroid injections.  She continues to be compliant with her anastrozole.  Review of Systems  Constitutional: Negative for chills, diaphoresis, fever, malaise/fatigue and weight loss.  HENT: Negative for nosebleeds and sore throat.   Eyes: Negative for double vision.  Respiratory: Negative for cough, hemoptysis, sputum production, shortness of breath and wheezing.   Cardiovascular: Negative for chest pain, palpitations, orthopnea and leg swelling.  Gastrointestinal: Negative for abdominal pain, blood in stool, constipation, diarrhea, heartburn, melena, nausea and vomiting.  Genitourinary: Negative  for dysuria, frequency and urgency.  Musculoskeletal: Positive for back pain and joint pain.  Skin: Negative.  Negative for itching and rash.  Neurological: Negative for dizziness, tingling, focal weakness, weakness and headaches.  Endo/Heme/Allergies: Does not bruise/bleed easily.  Psychiatric/Behavioral: Negative for depression. The patient is not nervous/anxious and does not have insomnia.      MEDICAL HISTORY:  Past Medical History:  Diagnosis Date  . Breast cancer ([place])   . Carcinoma of upper-inner quadrant of right breast in female, estrogen receptor positive ([place]) 06/07/2019  . Dyspnea   . Family history of adverse reaction to anesthesia    nausea and vomiting  . GERD (gastroesophageal reflux disease)   . Headache   . History of kidney stones   . Hypertension   . Personal history of radiation therapy 2020   Right lumpectomy  . Pneumonia     SURGICAL HISTORY: Past Surgical History:  Procedure Laterality Date  . APPENDECTOMY    . BREAST BIOPSY Right 2016   neg/ bx -clip  . BREAST BIOPSY Right 05/28/2019   x clip, stereo bx, invasive mammary carcinoma   . BREAST LUMPECTOMY Right 06/11/2019   INVASIVE MAMMARY CARCINOMA  . LITHOTRIPSY    . PARTIAL MASTECTOMY WITH NEEDLE LOCALIZATION AND AXILLARY SENTINEL LYMPH NODE BX Right 06/11/2019   Procedure: PARTIAL MASTECTOMY WITH NEEDLE LOCALIZATION AND AXILLARY SENTINEL LYMPH NODE BX;  Surgeon: Herbert Pun, MD;  Location: [facility] ORS;  Service: General;  Laterality: Right;  . TUBAL LIGATION    . WRIST FRACTURE SURGERY      SOCIAL HISTORY: Social History   Socioeconomic History  . Marital status: Divorced    Spouse name: Not on file  . Number of children: Not  on file  . Years of education: Not on file  . Highest education level: Not on file  Occupational History  . Not on file  Tobacco Use  . Smoking status: Never Smoker  . Smokeless tobacco: Never Used  Vaping Use  . Vaping Use: Never used  Substance and  Sexual Activity  . Alcohol use: Never  . Drug use: Never  . Sexual activity: Not Currently  Other Topics Concern  . Not on file  Social History Narrative   Lives in [place]/ with brother/ son& family. 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    Social Determinants of Health   Financial Resource Strain: Not on file  Food Insecurity: Not on file  Transportation Needs: Not on file  Physical Activity: Not on file  Stress: Not on file  Social Connections: Not on file  Intimate Partner Violence: Not on file    FAMILY HISTORY: Family History  Problem Relation Age of Onset  . Breast cancer Maternal Aunt 64  . Diabetes Mother     ALLERGIES:  has No Known Allergies.  MEDICATIONS:  Current Outpatient Medications  Medication Sig Dispense Refill  . acetaminophen (TYLENOL) 500 MG tablet Take 1,000 mg by mouth every 6 (six) hours as needed for moderate pain.    Marland Kitchen anastrozole (ARIMIDEX) 1 MG tablet Take 1 tablet (1 mg total) by mouth daily. 90 tablet 3  . aspirin EC 81 MG tablet Take 81 mg by mouth daily.    . Calcium Carb-Cholecalciferol (CALCIUM 600+D) 600-800 MG-UNIT TABS Take 1 tablet by mouth daily.    . meloxicam (MOBIC) 15 MG tablet Take 15 mg by mouth daily.    . Multiple Vitamins-Minerals (CENTRUM SILVER ADULT 50+ PO) Take 1 tablet by mouth daily.    . valsartan-hydrochlorothiazide (DIOVAN-HCT) 160-12.5 MG tablet Take 1 tablet by mouth daily.     No current facility-administered medications for this visit.      Marland Kitchen  PHYSICAL EXAMINATION: ECOG PERFORMANCE STATUS: 0 - Asymptomatic  Vitals:   11/20/20 1452  BP: 133/69  Pulse: 62  Resp: 16  Temp: 98 F (36.7 C)  SpO2: 100%   Filed Weights   11/20/20 1452  Weight: 234 lb 12.8 oz (106.5 kg)    Physical Exam HENT:     Head: Normocephalic and atraumatic.     Mouth/Throat:     Pharynx: No oropharyngeal exudate.  Eyes:     Pupils: Pupils are equal, round, and reactive to light.  Cardiovascular:     Rate  and Rhythm: Normal rate and regular rhythm.  Pulmonary:     Effort: No respiratory distress.     Breath sounds: No wheezing.  Abdominal:     General: Bowel sounds are normal. There is no distension.     Palpations: Abdomen is soft. There is no mass.     Tenderness: There is no abdominal tenderness. There is no guarding or rebound.  Musculoskeletal:        General: No tenderness. Normal range of motion.     Cervical back: Normal range of motion and neck supple.  Skin:    General: Skin is warm.  Neurological:     Mental Status: She is alert and oriented to person, place, and time.  Psychiatric:        Mood and Affect: Affect normal.      LABORATORY DATA:  I have reviewed the data as listed Lab Results  Component Value Date   WBC 7.0 09/08/2019   HGB 12.9  09/08/2019   HCT 40.3 09/08/2019   MCV 92.4 09/08/2019   PLT 257 09/08/2019   No results for input(s): NA, K, CL, CO2, GLUCOSE, BUN, CREATININE, CALCIUM, GFRNONAA, GFRAA, PROT, ALBUMIN, AST, ALT, ALKPHOS, BILITOT, BILIDIR, IBILI in the last 8760 hours.  RADIOGRAPHIC STUDIES: I have personally reviewed the radiological images as listed and agreed with the findings in the report. No results found.  ASSESSMENT & PLAN:   Carcinoma of upper-inner quadrant of right breast in female, estrogen receptor positive ([place]) #  Stage I ER PR positive HER-2 negative breast cancer- On Arimidex. STABLE  Mammo in October 2021-reviewed; within normal limits.  # Arthritis- sec AI- STABLE; recommend vit D 2000 units; also osteobi-flex. Right knee pain [Dr.Cole; [place]]   # JAN 2021 BMD- T score+ 0.-4; Normal-continue calcium plus vitamin D. STABLE.  # DISPOSITION:  # 6 month- MD-no labs;  Dr.B   All questions were answered. The patient/family knows to call the clinic with any problems, questions or concerns.    Cammie Sickle, MD 11/20/2020 3:12 PM

## 2021-03-21 HISTORY — PX: REPLACEMENT TOTAL KNEE: SUR1224

## 2021-03-28 ENCOUNTER — Ambulatory Visit: Payer: BC Managed Care – PPO | Admitting: Radiation Oncology

## 2021-04-09 DIAGNOSIS — Z471 Aftercare following joint replacement surgery: Secondary | ICD-10-CM | POA: Insufficient documentation

## 2021-04-09 DIAGNOSIS — Z96651 Presence of right artificial knee joint: Secondary | ICD-10-CM | POA: Insufficient documentation

## 2021-04-12 ENCOUNTER — Other Ambulatory Visit: Payer: Self-pay

## 2021-04-12 ENCOUNTER — Ambulatory Visit
Admission: RE | Admit: 2021-04-12 | Discharge: 2021-04-12 | Disposition: A | Discharge status: Home / Self Care | Payer: BC Managed Care – PPO | Source: Ambulatory Visit | Attending: Radiation Oncology | Admitting: Radiation Oncology

## 2021-04-12 DIAGNOSIS — C50211 Malignant neoplasm of upper-inner quadrant of right female breast: Secondary | ICD-10-CM | POA: Diagnosis not present

## 2021-04-12 DIAGNOSIS — Z923 Personal history of irradiation: Secondary | ICD-10-CM | POA: Insufficient documentation

## 2021-04-12 DIAGNOSIS — Z170 Estrogen receptor positive status [ER+]: Secondary | ICD-10-CM | POA: Diagnosis not present

## 2021-04-12 DIAGNOSIS — Z79811 Long term (current) use of aromatase inhibitors: Secondary | ICD-10-CM | POA: Diagnosis not present

## 2021-04-12 NOTE — Progress Notes (Signed)
Radiation Oncology Follow up Note  Name: Lisa Jennings   Date:   04/12/2021 MRN:  [id number] DOB: 08/16/1961    This 60 y.o. female presents to the clinic today for 25-monthfollow-up status post whole breast radiation to her right breast for stage I (T1b N0 M0) ER/PR positive invasive mammary carcinoma.  REFERRING PROVIDER: GHortencia Pilar MD  HPI: Patient is a 60year old female now about 18 months having completed whole breast radiation to her right breast for stage I ER/PR positive HER2 negative invasive mammary carcinoma.  Seen today in routine follow-up she is doing well she is recently had right knee replacement.  She is in PT for that.  She specifically denies breast tenderness cough or bone pain.  She had mammograms back in October which I have reviewed were BI-RADS 2 benign.  She is currently on Arimidex tolerating it well without side effect  COMPLICATIONS OF TREATMENT: none  FOLLOW UP COMPLIANCE: keeps appointments   PHYSICAL EXAM:  BP (P) 124/83 (BP Location: Left Arm, Patient Position: Sitting)   Pulse (P) 75   Temp (P) 98.7 F (37.1 C) (Tympanic)   Resp (P) 18   Wt (P) 232 lb 11.2 oz (105.6 kg)   BMI (P) 39.33 kg/m  Lungs are clear to A&P cardiac examination essentially unremarkable with regular rate and rhythm. No dominant mass or nodularity is noted in either breast in 2 positions examined. Incision is well-healed. No axillary or supraclavicular adenopathy is appreciated. Cosmetic result is excellent.  Well-developed well-nourished patient in NAD. HEENT reveals PERLA, EOMI, discs not visualized.  Oral cavity is clear. No oral mucosal lesions are identified. Neck is clear without evidence of cervical or supraclavicular adenopathy. Lungs are clear to A&P. Cardiac examination is essentially unremarkable with regular rate and rhythm without murmur rub or thrill. Abdomen is benign with no organomegaly or masses noted. Motor sensory and DTR levels are equal and symmetric  in the upper and lower extremities. Cranial nerves II through XII are grossly intact. Proprioception is intact. No peripheral adenopathy or edema is identified. No motor or sensory levels are noted. Crude visual fields are within normal range.  RADIOLOGY RESULTS: Mammograms reviewed compatible with above-stated findings  PLAN: Present time patient continues to do well with no evidence of disease.  Of asked to see her back in 1 year for follow-up.  She is also scheduled for follow-up mammograms.  She continues on Arimidex without side effect.  Patient is to call with any concerns.  I would like to take this opportunity to thank you for allowing me to participate in the care of your patient..Noreene Filbert MD

## 2021-05-07 ENCOUNTER — Other Ambulatory Visit: Payer: Self-pay | Admitting: General Surgery

## 2021-05-07 DIAGNOSIS — Z853 Personal history of malignant neoplasm of breast: Secondary | ICD-10-CM

## 2021-05-21 ENCOUNTER — Inpatient Hospital Stay: Payer: BC Managed Care – PPO | Attending: Internal Medicine | Admitting: Internal Medicine

## 2021-05-21 ENCOUNTER — Other Ambulatory Visit: Payer: Self-pay

## 2021-05-21 ENCOUNTER — Encounter: Payer: Self-pay | Admitting: Internal Medicine

## 2021-05-21 DIAGNOSIS — Z170 Estrogen receptor positive status [ER+]: Secondary | ICD-10-CM | POA: Insufficient documentation

## 2021-05-21 DIAGNOSIS — C50211 Malignant neoplasm of upper-inner quadrant of right female breast: Secondary | ICD-10-CM | POA: Diagnosis present

## 2021-05-21 DIAGNOSIS — Z79811 Long term (current) use of aromatase inhibitors: Secondary | ICD-10-CM | POA: Diagnosis not present

## 2021-05-21 NOTE — Progress Notes (Signed)
[facility] [place] NOTE  Patient Care Team: Hortencia Pilar, MD as PCP - General (Family Medicine) Cammie Sickle, MD as Consulting Physician (Internal Medicine) Herbert Pun, MD as Consulting Physician (General Surgery) Noreene Filbert, MD as Referring Physician (Radiation Oncology)  CHIEF COMPLAINTS/PURPOSE OF CONSULTATION: Breast cancer  #  Oncology History Overview Note  # OCT 2020-RIGHT BREAST, UPPER INNER QUADRANT;  INVASIVE MAMMARY CARCINOMA, NO SPECIAL TYPE. S/p Lumpectomy & SLNBx [Dr.Cintron]pT1b pN0; G-1; ER/PR >90%; her 2neu-NEG;   # feb 1st 2021- Arimidex  # survivorship: pending; LMP- 2011/ No TAH or BSO  DIAGNOSIS: Right breast cancer  STAGE:    1     ;  GOALS: Cure  CURRENT/MOST RECENT THERAPY : anastrazole    Carcinoma of upper-inner quadrant of right breast in female, estrogen receptor positive ([place])  06/07/2019 Initial Diagnosis   Carcinoma of upper-inner quadrant of right breast in female, estrogen receptor positive ([place])      HISTORY OF PRESENTING ILLNESS:  Lisa Jennings 59 y.o.  female with above history of of right breast cancer stage I ER/PRpos; her2 NEG on arimidex is here for follow-up.  In the interim patient underwent right knee replacement.  Tolerating well.  She is currently off work-on physical therapy.  She continues to be compliant with her anastrozole.  Hot flashes.  Review of Systems  Constitutional:  Negative for chills, diaphoresis, fever, malaise/fatigue and weight loss.  HENT:  Negative for nosebleeds and sore throat.   Eyes:  Negative for double vision.  Respiratory:  Negative for cough, hemoptysis, sputum production, shortness of breath and wheezing.   Cardiovascular:  Negative for chest pain, palpitations, orthopnea and leg swelling.  Gastrointestinal:  Negative for abdominal pain, blood in stool, constipation, diarrhea, heartburn, melena, nausea and vomiting.  Genitourinary:  Negative for  dysuria, frequency and urgency.  Musculoskeletal:  Positive for back pain and joint pain.  Skin: Negative.  Negative for itching and rash.  Neurological:  Negative for dizziness, tingling, focal weakness, weakness and headaches.  Endo/Heme/Allergies:  Does not bruise/bleed easily.  Psychiatric/Behavioral:  Negative for depression. The patient is not nervous/anxious and does not have insomnia.     MEDICAL HISTORY:  Past Medical History:  Diagnosis Date   Breast cancer ([place])    Carcinoma of upper-inner quadrant of right breast in female, estrogen receptor positive ([place]) 06/07/2019   Dyspnea    Family history of adverse reaction to anesthesia    nausea and vomiting   GERD (gastroesophageal reflux disease)    Headache    History of kidney stones    Hypertension    Personal history of radiation therapy 2020   Right lumpectomy   Pneumonia     SURGICAL HISTORY: Past Surgical History:  Procedure Laterality Date   APPENDECTOMY     BREAST BIOPSY Right 2016   neg/ bx -clip   BREAST BIOPSY Right 05/28/2019   x clip, stereo bx, invasive mammary carcinoma    BREAST LUMPECTOMY Right 06/11/2019   INVASIVE MAMMARY CARCINOMA   LITHOTRIPSY     PARTIAL MASTECTOMY WITH NEEDLE LOCALIZATION AND AXILLARY SENTINEL LYMPH NODE BX Right 06/11/2019   Procedure: PARTIAL MASTECTOMY WITH NEEDLE LOCALIZATION AND AXILLARY SENTINEL LYMPH NODE BX;  Surgeon: Herbert Pun, MD;  Location: [facility] ORS;  Service: General;  Laterality: Right;   TUBAL LIGATION     WRIST FRACTURE SURGERY      SOCIAL HISTORY: Social History   Socioeconomic History   Marital status: Divorced    Spouse name:  Not on file   Number of children: Not on file   Years of education: Not on file   Highest education level: Not on file  Occupational History   Not on file  Tobacco Use   Smoking status: Never   Smokeless tobacco: Never  Vaping Use   Vaping Use: Never used  Substance and Sexual Activity   Alcohol use: Never    Drug use: Never   Sexual activity: Not Currently  Other Topics Concern   Not on file  Social History Narrative   Lives in [place]/ with brother/ son& family. 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    Social Determinants of Health   Financial Resource Strain: Not on file  Food Insecurity: Not on file  Transportation Needs: Not on file  Physical Activity: Not on file  Stress: Not on file  Social Connections: Not on file  Intimate Partner Violence: Not on file    FAMILY HISTORY: Family History  Problem Relation Age of Onset   Breast cancer Maternal Aunt 64   Diabetes Mother     ALLERGIES:  has No Known Allergies.  MEDICATIONS:  Current Outpatient Medications  Medication Sig Dispense Refill   acetaminophen (TYLENOL) 500 MG tablet Take 1,000 mg by mouth every 6 (six) hours as needed for moderate pain.     anastrozole (ARIMIDEX) 1 MG tablet Take 1 tablet (1 mg total) by mouth daily. 90 tablet 3   aspirin EC 81 MG tablet Take 81 mg by mouth daily.     Boswellia-Glucosamine-Vit D (OSTEO BI-FLEX ONE PER DAY PO) Take 1 tablet by mouth daily at 12 noon.     Calcium Carb-Cholecalciferol (CALCIUM 600+D) 600-800 MG-UNIT TABS Take 1 tablet by mouth daily.     meloxicam (MOBIC) 15 MG tablet Take 15 mg by mouth daily.     Multiple Vitamins-Minerals (CENTRUM SILVER ADULT 50+ PO) Take 1 tablet by mouth daily.     valsartan-hydrochlorothiazide (DIOVAN-HCT) 160-12.5 MG tablet Take 1 tablet by mouth daily.     No current facility-administered medications for this visit.      Marland Kitchen  PHYSICAL EXAMINATION: ECOG PERFORMANCE STATUS: 0 - Asymptomatic  Vitals:   05/21/21 0943  BP: 117/73  Pulse: 76  Resp: 20  Temp: (!) 97.4 F (36.3 C)   Filed Weights   05/21/21 0943  Weight: 233 lb (105.7 kg)    Physical Exam HENT:     Head: Normocephalic and atraumatic.     Mouth/Throat:     Pharynx: No oropharyngeal exudate.  Eyes:     Pupils: Pupils are equal, round, and reactive to  light.  Cardiovascular:     Rate and Rhythm: Normal rate and regular rhythm.  Pulmonary:     Effort: No respiratory distress.     Breath sounds: No wheezing.  Abdominal:     General: Bowel sounds are normal. There is no distension.     Palpations: Abdomen is soft. There is no mass.     Tenderness: There is no abdominal tenderness. There is no guarding or rebound.  Musculoskeletal:        General: No tenderness. Normal range of motion.     Cervical back: Normal range of motion and neck supple.  Skin:    General: Skin is warm.  Neurological:     Mental Status: She is alert and oriented to person, place, and time.  Psychiatric:        Mood and Affect: Affect normal.     LABORATORY DATA:  I have reviewed the data as listed Lab Results  Component Value Date   WBC 7.0 09/08/2019   HGB 12.9 09/08/2019   HCT 40.3 09/08/2019   MCV 92.4 09/08/2019   PLT 257 09/08/2019   No results for input(s): NA, K, CL, CO2, GLUCOSE, BUN, CREATININE, CALCIUM, GFRNONAA, GFRAA, PROT, ALBUMIN, AST, ALT, ALKPHOS, BILITOT, BILIDIR, IBILI in the last 8760 hours.  RADIOGRAPHIC STUDIES: I have personally reviewed the radiological images as listed and agreed with the findings in the report. No results found.  ASSESSMENT & PLAN:   Carcinoma of upper-inner quadrant of right breast in female, estrogen receptor positive ([place]) #  Stage I ER PR positive HER-2 negative breast cancer- On Arimidex. STABLE  Mammo in October 2021; Await mammo-Dr.Cintron/oct 2022- -reviewed; within normal limits.  # Arthritis- sec AI- STABLE; recommend vit D 2000 units; also osteobi-flex. s/p Right knee pain [ [place] on July 27th] - on PT.   # Hot flashes G-1-sec to Anastrazole; monitor for now.   # JAN 2021 BMD- T score+ 0.-4; Normal-continue calcium plus vitamin D. STABLE.  # DISPOSITION:  # 6 month- MD-cbc/cmp- Dr.B  All questions were answered. The patient/family knows to call the clinic with any problems, questions or  concerns.    Cammie Sickle, MD 05/21/2021 3:12 PM

## 2021-05-21 NOTE — Assessment & Plan Note (Signed)
#  Stage I ER PR positive HER-2 negative breast cancer- On Arimidex. STABLE  Mammo in October 2021; Await mammo-Dr.Cintron/oct 2022- -reviewed; within normal limits.  # Arthritis- sec AI- STABLE; recommend vit D 2000 units; also osteobi-flex. s/p Right knee pain [ [place] on July 27th] - on PT.   # Hot flashes G-1-sec to Anastrazole; monitor for now.   # JAN 2021 BMD- T score+ 0.-4; Normal-continue calcium plus vitamin D. STABLE.  # DISPOSITION:  # 6 month- MD-cbc/cmp- Dr.B

## 2021-05-28 ENCOUNTER — Other Ambulatory Visit: Payer: Self-pay

## 2021-05-28 ENCOUNTER — Ambulatory Visit
Admission: RE | Admit: 2021-05-28 | Discharge: 2021-05-28 | Disposition: A | Discharge status: Home / Self Care | Payer: BC Managed Care – PPO | Source: Ambulatory Visit | Attending: General Surgery | Admitting: General Surgery

## 2021-05-28 DIAGNOSIS — Z853 Personal history of malignant neoplasm of breast: Secondary | ICD-10-CM | POA: Insufficient documentation

## 2021-07-18 ENCOUNTER — Other Ambulatory Visit: Payer: Self-pay

## 2021-07-18 ENCOUNTER — Encounter: Payer: Self-pay | Admitting: Internal Medicine

## 2021-07-18 DIAGNOSIS — C50211 Malignant neoplasm of upper-inner quadrant of right female breast: Secondary | ICD-10-CM

## 2021-07-18 DIAGNOSIS — Z170 Estrogen receptor positive status [ER+]: Secondary | ICD-10-CM

## 2021-07-18 NOTE — Progress Notes (Signed)
error 

## 2021-07-23 ENCOUNTER — Other Ambulatory Visit: Payer: Self-pay | Admitting: *Deleted

## 2021-07-23 MED ORDER — ANASTROZOLE 1 MG PO TABS: 1.0000 mg | ORAL_TABLET | Freq: Every day | ORAL | 1 refills | Status: DC

## 2021-07-26 ENCOUNTER — Other Ambulatory Visit: Payer: Self-pay | Admitting: Internal Medicine

## 2021-11-19 ENCOUNTER — Inpatient Hospital Stay: Payer: PRIVATE HEALTH INSURANCE | Attending: Internal Medicine | Admitting: Medical Oncology

## 2021-11-19 ENCOUNTER — Other Ambulatory Visit: Payer: Self-pay

## 2021-11-19 ENCOUNTER — Encounter: Payer: Self-pay | Admitting: Medical Oncology

## 2021-11-19 VITALS — BP 134/79 | HR 74 | Temp 97.7°F | Resp 18 | Ht 64.5 in | Wt 242.0 lb

## 2021-11-19 DIAGNOSIS — Z170 Estrogen receptor positive status [ER+]: Secondary | ICD-10-CM

## 2021-11-19 DIAGNOSIS — C50211 Malignant neoplasm of upper-inner quadrant of right female breast: Secondary | ICD-10-CM | POA: Diagnosis not present

## 2021-11-19 DIAGNOSIS — Z853 Personal history of malignant neoplasm of breast: Secondary | ICD-10-CM | POA: Insufficient documentation

## 2021-11-19 DIAGNOSIS — I10 Essential (primary) hypertension: Secondary | ICD-10-CM | POA: Diagnosis not present

## 2021-11-19 DIAGNOSIS — Z803 Family history of malignant neoplasm of breast: Secondary | ICD-10-CM | POA: Insufficient documentation

## 2021-11-19 NOTE — Progress Notes (Signed)
[facility] [place] ?CONSULT NOTE ? ?Patient Care Team: ?[facility] as PCP - General ?Cammie Sickle, MD as Consulting Physician (Internal Medicine) ?Herbert Pun, MD as Consulting Physician (General Surgery) ?Noreene Filbert, MD as Referring Physician (Radiation Oncology) ? ?CHIEF COMPLAINTS/PURPOSE OF CONSULTATION: Breast cancer ? ?#  ?Oncology History Overview Note  ?# OCT 2020-RIGHT BREAST, UPPER INNER QUADRANT;  INVASIVE MAMMARY CARCINOMA, NO SPECIAL TYPE. S/p Lumpectomy & SLNBx [Dr.Cintron]pT1b pN0; G-1; ER/PR >90%; her 2neu-NEG;  ? ?# feb 1st 2021- Arimidex ? ?# survivorship: pending; LMP- 2011/ No TAH or BSO ? ?DIAGNOSIS: Right breast cancer ? ?STAGE:    1     ;  GOALS: Cure ? ?CURRENT/MOST RECENT THERAPY : anastrazole ? ?  ?Carcinoma of upper-inner quadrant of right breast in female, estrogen receptor positive ([place])  ?06/07/2019 Initial Diagnosis  ? Carcinoma of upper-inner quadrant of right breast in female, estrogen receptor positive ([place]) ?  ? ? ? ?HISTORY OF PRESENTING ILLNESS:  ?Lisa Jennings 61 y.o.  female with above history of of right breast cancer stage I ER/PRpos; her2 NEG on arimidex is here for follow-up. ? ?She continues to be compliant with her anastrozole.  Hot flashes do give her some trouble but she states that she can tolerate them well. She does not wish to make a change at this time. No lesions, lumps, bumps, discharge or pain in breasts. Her mammograms are in Oct. She is taking calcium and vitamin D. No new or worsening pain.  ? ?Review of Systems  ?Constitutional:  Negative for chills, diaphoresis, fever, malaise/fatigue and weight loss.  ?HENT:  Negative for nosebleeds and sore throat.   ?Eyes:  Negative for double vision.  ?Respiratory:  Negative for cough, hemoptysis, sputum production, shortness of breath and wheezing.   ?Cardiovascular:  Negative for chest pain, palpitations, orthopnea and leg swelling.  ?Gastrointestinal:  Negative  for abdominal pain, blood in stool, constipation, diarrhea, heartburn, melena, nausea and vomiting.  ?Genitourinary:  Negative for dysuria, frequency and urgency.  ?Musculoskeletal:  Positive for back pain and joint pain.  ?Skin: Negative.  Negative for itching and rash.  ?Neurological:  Negative for dizziness, tingling, focal weakness, weakness and headaches.  ?Endo/Heme/Allergies:  Does not bruise/bleed easily.  ?Psychiatric/Behavioral:  Negative for depression. The patient is not nervous/anxious and does not have insomnia.    ? ?MEDICAL HISTORY:  ?Past Medical History:  ?Diagnosis Date  ? Breast cancer ([place])   ? Carcinoma of upper-inner quadrant of right breast in female, estrogen receptor positive ([place]) 06/07/2019  ? Dyspnea   ? Family history of adverse reaction to anesthesia   ? nausea and vomiting  ? GERD (gastroesophageal reflux disease)   ? Headache   ? History of kidney stones   ? Hypertension   ? Personal history of radiation therapy 2020  ? Right lumpectomy  ? Pneumonia   ? ? ?SURGICAL HISTORY: ?Past Surgical History:  ?Procedure Laterality Date  ? APPENDECTOMY    ? BREAST BIOPSY Right 2016  ? neg/ bx -clip  ? BREAST BIOPSY Right 05/28/2019  ? x clip, stereo bx, invasive mammary carcinoma   ? BREAST LUMPECTOMY Right 06/11/2019  ? INVASIVE MAMMARY CARCINOMA  ? LITHOTRIPSY    ? PARTIAL MASTECTOMY WITH NEEDLE LOCALIZATION AND AXILLARY SENTINEL LYMPH NODE BX Right 06/11/2019  ? Procedure: PARTIAL MASTECTOMY WITH NEEDLE LOCALIZATION AND AXILLARY SENTINEL LYMPH NODE BX;  Surgeon: Herbert Pun, MD;  Location: [facility] ORS;  Service: General;  Laterality: Right;  ? REPLACEMENT  TOTAL KNEE Right 03/21/2021  ? TUBAL LIGATION    ? WRIST FRACTURE SURGERY    ? ? ?SOCIAL HISTORY: ?Social History  ? ?Socioeconomic History  ? Marital status: Divorced  ?  Spouse name: Not on file  ? Number of children: Not on file  ? Years of education: Not on file  ? Highest education level: Not on file  ?Occupational History  ? Not  on file  ?Tobacco Use  ? Smoking status: Never  ? Smokeless tobacco: Never  ?Vaping Use  ? Vaping Use: Never used  ?Substance and Sexual Activity  ? Alcohol use: Never  ? Drug use: Never  ? Sexual activity: Not Currently  ?Other Topics Concern  ? Not on file  ?Social History Narrative  ? Lives in [place]/ with brother/ son& family. 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   ? ?Social Determinants of Health  ? ?Financial Resource Strain: Not on file  ?Food Insecurity: Not on file  ?Transportation Needs: Not on file  ?Physical Activity: Not on file  ?Stress: Not on file  ?Social Connections: Not on file  ?Intimate Partner Violence: Not on file  ? ? ?FAMILY HISTORY: ?Family History  ?Problem Relation Age of Onset  ? Breast cancer Maternal Aunt 72  ? Diabetes Mother   ? ? ?ALLERGIES:  has No Known Allergies. ? ?MEDICATIONS:  ?Current Outpatient Medications  ?Medication Sig Dispense Refill  ? acetaminophen (TYLENOL) 500 MG tablet Take 1,000 mg by mouth every 6 (six) hours as needed for moderate pain.    ? anastrozole (ARIMIDEX) 1 MG tablet Take 1 tablet (1 mg total) by mouth daily. 90 tablet 1  ? aspirin EC 81 MG tablet Take 81 mg by mouth daily.    ? Boswellia-Glucosamine-Vit D (OSTEO BI-FLEX ONE PER DAY PO) Take 1 tablet by mouth daily at 12 noon.    ? Calcium Carb-Cholecalciferol (CALCIUM 600+D) 600-800 MG-UNIT TABS Take 1 tablet by mouth daily.    ? meloxicam (MOBIC) 15 MG tablet Take 15 mg by mouth daily.    ? Multiple Vitamins-Minerals (CENTRUM SILVER ADULT 50+ PO) Take 1 tablet by mouth daily.    ? valsartan-hydrochlorothiazide (DIOVAN-HCT) 160-12.5 MG tablet Take 1 tablet by mouth daily.    ? ?No current facility-administered medications for this visit.  ? ? ?  ?. ? ?PHYSICAL EXAMINATION: ?ECOG PERFORMANCE STATUS: 0 - Asymptomatic ? ?Vitals:  ? 11/19/21 0949  ?BP: 134/79  ?Pulse: 74  ?Resp: 18  ?Temp: 97.7 ?F (36.5 ?C)  ?SpO2: 99%  ? ?Filed Weights  ? 11/19/21 0949  ?Weight: 242 lb (109.8 kg)   ? ? ?Physical Exam ?HENT:  ?   Head: Normocephalic and atraumatic.  ?   Mouth/Throat:  ?   Pharynx: No oropharyngeal exudate.  ?Eyes:  ?   Pupils: Pupils are equal, round, and reactive to light.  ?Cardiovascular:  ?   Rate and Rhythm: Normal rate and regular rhythm.  ?Pulmonary:  ?   Effort: No respiratory distress.  ?   Breath sounds: No wheezing.  ?Chest:  ?   Chest wall: No mass, swelling or tenderness.  ?Breasts: ?   Right: No swelling, mass, nipple discharge or tenderness.  ?   Left: No swelling, mass, nipple discharge or tenderness.  ?Abdominal:  ?   General: Bowel sounds are normal. There is no distension.  ?   Palpations: Abdomen is soft. There is no mass.  ?   Tenderness: There is no abdominal tenderness. There is no guarding  or rebound.  ?Musculoskeletal:     ?   General: No tenderness. Normal range of motion.  ?   Cervical back: Normal range of motion and neck supple.  ?Lymphadenopathy:  ?   Upper Body:  ?   Right upper body: No supraclavicular, axillary or pectoral adenopathy.  ?   Left upper body: No supraclavicular, axillary or pectoral adenopathy.  ?Skin: ?   General: Skin is warm.  ?Neurological:  ?   Mental Status: She is alert and oriented to person, place, and time.  ?Psychiatric:     ?   Mood and Affect: Affect normal.  ? ? ? ?LABORATORY DATA:  ?I have reviewed the data as listed ?Lab Results  ?Component Value Date  ? WBC 7.0 09/08/2019  ? HGB 12.9 09/08/2019  ? HCT 40.3 09/08/2019  ? MCV 92.4 09/08/2019  ? PLT 257 09/08/2019  ? ?No results for input(s): NA, K, CL, CO2, GLUCOSE, BUN, CREATININE, CALCIUM, GFRNONAA, GFRAA, PROT, ALBUMIN, AST, ALT, ALKPHOS, BILITOT, BILIDIR, IBILI in the last 8760 hours. ? ?RADIOGRAPHIC STUDIES: ?I have personally reviewed the radiological images as listed and agreed with the findings in the report. ?No results found. ? ?ASSESSMENT & PLAN:  ?Encounter Diagnosis  ?Name Primary?  ? Carcinoma of upper-inner quadrant of right breast in female, estrogen receptor  positive ([place]) Yes  ? ?Chronic and in survivorship follow up. Her Anastrazole does cause some hot flashes but she states that she is doing well and does not wish to switch this medication at this time. She will continue

## 2022-01-03 ENCOUNTER — Other Ambulatory Visit: Payer: Self-pay | Admitting: *Deleted

## 2022-01-03 MED ORDER — ANASTROZOLE 1 MG PO TABS: 1.0000 mg | ORAL_TABLET | Freq: Every day | ORAL | 1 refills | Status: DC

## 2022-03-15 ENCOUNTER — Other Ambulatory Visit: Payer: Self-pay

## 2022-03-15 ENCOUNTER — Ambulatory Visit
Admission: EM | Admit: 2022-03-15 | Discharge: 2022-03-15 | Disposition: A | Discharge status: Home / Self Care | Payer: 59 | Attending: Nurse Practitioner | Admitting: Nurse Practitioner

## 2022-03-15 ENCOUNTER — Encounter: Payer: Self-pay | Admitting: Emergency Medicine

## 2022-03-15 ENCOUNTER — Ambulatory Visit (INDEPENDENT_AMBULATORY_CARE_PROVIDER_SITE_OTHER): Payer: 59

## 2022-03-15 DIAGNOSIS — J40 Bronchitis, not specified as acute or chronic: Secondary | ICD-10-CM

## 2022-03-15 DIAGNOSIS — R079 Chest pain, unspecified: Secondary | ICD-10-CM

## 2022-03-15 DIAGNOSIS — R0602 Shortness of breath: Secondary | ICD-10-CM | POA: Diagnosis not present

## 2022-03-15 DIAGNOSIS — R059 Cough, unspecified: Secondary | ICD-10-CM

## 2022-03-15 DIAGNOSIS — R051 Acute cough: Secondary | ICD-10-CM

## 2022-03-15 MED ORDER — ALBUTEROL SULFATE HFA 108 (90 BASE) MCG/ACT IN AERS: 1.0000 | INHALATION_SPRAY | Freq: Four times a day (QID) | RESPIRATORY_TRACT | 0 refills | Status: DC | PRN

## 2022-03-15 MED ORDER — BENZONATATE 100 MG PO CAPS: 100.0000 mg | ORAL_CAPSULE | Freq: Three times a day (TID) | ORAL | 0 refills | Status: AC | PRN

## 2022-03-15 MED ORDER — AMOXICILLIN-POT CLAVULANATE 875-125 MG PO TABS: 1.0000 | ORAL_TABLET | Freq: Two times a day (BID) | ORAL | 0 refills | Status: DC

## 2022-03-15 NOTE — ED Provider Notes (Signed)
[facility]    CSN: [id number] Arrival date & time: 03/15/22  1191      History   Chief Complaint Chief Complaint  Patient presents with   Cough    HPI Lisa Jennings is a 61 y.o. female.   HPI Lisa Jennings is in today for 5 days of cough, wheezing, shortness of breath and chest tightness.Denies fever, headache, dizziness, visual changes, nausea, vomiting or any edema. She denies being around any sick persons. She denies any recent history of pnx or bronchitis. She has used Mucinex with minimal relief.    Past Medical History:  Diagnosis Date   Breast cancer ([place])    Carcinoma of upper-inner quadrant of right breast in female, estrogen receptor positive ([place]) 06/07/2019   Dyspnea    Family history of adverse reaction to anesthesia    nausea and vomiting   GERD (gastroesophageal reflux disease)    Headache    History of kidney stones    Hypertension    Personal history of radiation therapy 2020   Right lumpectomy   Pneumonia     Patient Active Problem List   Diagnosis Date Noted   BMI 38.0-38.9,adult 03/28/2020   Carcinoma of upper-inner quadrant of right breast in female, estrogen receptor positive ([place]) 06/07/2019   Essential hypertension 05/26/2018   History of kidney stones 05/25/2015   Trigger middle finger of left hand 08/12/2014   Joint stiffness of hand 05/27/2013   Impingement syndrome of right shoulder 07/10/2012   Lateral epicondylitis 05/08/2012   Shoulder pain 05/08/2012    Past Surgical History:  Procedure Laterality Date   APPENDECTOMY     BREAST BIOPSY Right 2016   neg/ bx -clip   BREAST BIOPSY Right 05/28/2019   x clip, stereo bx, invasive mammary carcinoma    BREAST LUMPECTOMY Right 06/11/2019   INVASIVE MAMMARY CARCINOMA   LITHOTRIPSY     PARTIAL MASTECTOMY WITH NEEDLE LOCALIZATION AND AXILLARY SENTINEL LYMPH NODE BX Right 06/11/2019   Procedure: PARTIAL MASTECTOMY WITH NEEDLE LOCALIZATION AND AXILLARY SENTINEL LYMPH  NODE BX;  Surgeon: Herbert Pun, MD;  Location: [facility] ORS;  Service: General;  Laterality: Right;   REPLACEMENT TOTAL KNEE Right 03/21/2021   TUBAL LIGATION     WRIST FRACTURE SURGERY      OB History   No obstetric history on file.      Home Medications    Prior to Admission medications   Medication Sig Start Date End Date Taking? Authorizing Provider  acetaminophen (TYLENOL) 500 MG tablet Take 1,000 mg by mouth every 6 (six) hours as needed for moderate pain.   Yes [provider]  albuterol (VENTOLIN HFA) 108 (90 Base) MCG/ACT inhaler Inhale 1-2 puffs into the lungs every 6 (six) hours as needed for wheezing or shortness of breath. 03/15/22  Yes Vevelyn Francois, NP  amoxicillin-clavulanate (AUGMENTIN) 875-125 MG tablet Take 1 tablet by mouth every 12 (twelve) hours. 03/15/22  Yes Vevelyn Francois, NP  anastrozole (ARIMIDEX) 1 MG tablet Take 1 tablet (1 mg total) by mouth daily. 01/03/22  Yes Cammie Sickle, MD  aspirin EC 81 MG tablet Take 81 mg by mouth daily.   Yes [provider]  benzonatate (TESSALON) 100 MG capsule Take 1 capsule (100 mg total) by mouth 3 (three) times daily as needed for up to 10 days for cough. Never suck or chew on a benzonatate capsule. 03/15/22 03/25/22 Yes Vevelyn Francois, NP  Boswellia-Glucosamine-Vit D (OSTEO BI-FLEX ONE PER DAY PO) Take  1 tablet by mouth daily at 12 noon.   Yes [provider]  Calcium Carb-Cholecalciferol (CALCIUM 600+D) 600-800 MG-UNIT TABS Take 1 tablet by mouth daily.   Yes [provider]  meloxicam (MOBIC) 15 MG tablet Take 15 mg by mouth daily. 04/07/19  Yes [provider]  Multiple Vitamins-Minerals (CENTRUM SILVER ADULT 50+ PO) Take 1 tablet by mouth daily.   Yes [provider]  valsartan-hydrochlorothiazide (DIOVAN-HCT) 160-12.5 MG tablet Take 1 tablet by mouth daily. 05/06/19  Yes [provider]    Family History Family History  Problem Relation Age of  Onset   Breast cancer Maternal Aunt 64   Diabetes Mother     Social History Social History   Tobacco Use   Smoking status: Never   Smokeless tobacco: Never  Vaping Use   Vaping Use: Never used  Substance Use Topics   Alcohol use: Never   Drug use: Never     Allergies   Patient has no known allergies.   Review of Systems Review of Systems   Physical Exam Triage Vital Signs ED Triage Vitals  Enc Vitals Group     BP 03/15/22 1005 139/71     Pulse Rate 03/15/22 1005 74     Resp 03/15/22 1005 16     Temp 03/15/22 1005 98.1 F (36.7 C)     Temp Source 03/15/22 1005 Oral     SpO2 03/15/22 1005 97 %     Weight 03/15/22 1003 242 lb 1 oz (109.8 kg)     Height 03/15/22 1003 5' 4.5" (1.638 m)     Head Circumference --      Peak Flow --      Pain Score 03/15/22 1002 7     Pain Loc --      Pain Edu? --      Excl. in [place]? --    No data found.  Updated Vital Signs BP 139/71 (BP Location: Left Arm)   Pulse 74   Temp 98.1 F (36.7 C) (Oral)   Resp 16   Ht 5' 4.5" (1.638 m)   Wt 242 lb 1 oz (109.8 kg)   SpO2 97%   BMI 40.91 kg/m   Visual Acuity Right Eye Distance:   Left Eye Distance:   Bilateral Distance:    Right Eye Near:   Left Eye Near:    Bilateral Near:     Physical Exam Constitutional:      General: She is not in acute distress.    Appearance: She is obese. She is not ill-appearing, toxic-appearing or diaphoretic.  HENT:     Head: Normocephalic and atraumatic.     Nose: Nose normal.     Mouth/Throat:     Mouth: Mucous membranes are moist.  Cardiovascular:     Rate and Rhythm: Normal rate and regular rhythm.     Pulses: Normal pulses.     Heart sounds: Normal heart sounds.  Pulmonary:     Effort: Pulmonary effort is normal.     Breath sounds: Normal breath sounds. No wheezing, rhonchi or rales.  Chest:     Chest wall: No tenderness.  Musculoskeletal:     Cervical back: Normal range of motion.  Skin:    General: Skin is warm.     Capillary  Refill: Capillary refill takes less than 2 seconds.  Neurological:     General: No focal deficit present.     Mental Status: She is alert and oriented to person, place, and  time.  Psychiatric:        Mood and Affect: Mood normal.        Behavior: Behavior normal.        Thought Content: Thought content normal.        Judgment: Judgment normal.      UC Treatments / Results  Labs (all labs ordered are listed, but only abnormal results are displayed) Labs Reviewed - No data to display  EKG   Radiology No results found.  Procedures Procedures (including critical care time)  Medications Ordered in UC Medications - No data to display  Initial Impression / Assessment and Plan / UC Course  I have reviewed the triage vital signs and the nursing notes.  Pertinent labs & imaging results that were available during my care of the patient were reviewed by me and considered in my medical decision making (see chart for details).     Cough URI vs Bronchitis  Chest xray negative  Treat as below  Final Clinical Impressions(s) / UC Diagnoses   Final diagnoses:  Acute cough  Cough, unspecified type  Bronchitis     Discharge Instructions      Your chest xray was negative for any acute abnormalities. Started Augmentin 875 mg BID for 7 days  continue the Mucinex . Hydrate well with water We encourage conservative treatment with other symptom relief. Albuterol inhaler 1-2 puffs 4-6 hours needed for chest tightness or shortness of breath Benzonatate 100 mg three times a day for cough Your cough can be soothed with a cough suppressant       ED Prescriptions     Medication Sig Dispense Auth. Provider   albuterol (VENTOLIN HFA) 108 (90 Base) MCG/ACT inhaler Inhale 1-2 puffs into the lungs every 6 (six) hours as needed for wheezing or shortness of breath. 8 g Vevelyn Francois, NP   benzonatate (TESSALON) 100 MG capsule Take 1 capsule (100 mg total) by mouth 3 (three) times daily  as needed for up to 10 days for cough. Never suck or chew on a benzonatate capsule. 30 capsule Dionisio David M, NP   amoxicillin-clavulanate (AUGMENTIN) 875-125 MG tablet Take 1 tablet by mouth every 12 (twelve) hours. 14 tablet Vevelyn Francois, NP      PDMP not reviewed this encounter.   Dionisio David [place], [place] 03/15/22 1156

## 2022-03-15 NOTE — ED Triage Notes (Signed)
Pt c/o cough, wheezing, shortness of breath, and chest tightness. Started about 5 days ago. She has tried mucinex without relief. Pt denies fever, or other respiratory symptoms.

## 2022-03-15 NOTE — Discharge Instructions (Addendum)
Your chest xray was negative for any acute abnormalities. Started Augmentin 875 mg BID for 7 days  continue the Mucinex . Hydrate well with water We encourage conservative treatment with other symptom relief. Albuterol inhaler 1-2 puffs 4-6 hours needed for chest tightness or shortness of breath Benzonatate 100 mg three times a day for cough Your cough can be soothed with a cough suppressant

## 2022-04-06 ENCOUNTER — Other Ambulatory Visit: Payer: Self-pay | Admitting: Nurse Practitioner

## 2022-04-11 ENCOUNTER — Ambulatory Visit: Payer: BC Managed Care – PPO | Admitting: Radiation Oncology

## 2022-04-24 ENCOUNTER — Other Ambulatory Visit: Payer: Self-pay | Admitting: General Surgery

## 2022-04-24 DIAGNOSIS — Z1231 Encounter for screening mammogram for malignant neoplasm of breast: Secondary | ICD-10-CM

## 2022-05-22 ENCOUNTER — Ambulatory Visit
Admission: RE | Admit: 2022-05-22 | Discharge: 2022-05-22 | Disposition: A | Discharge status: Home / Self Care | Payer: 59 | Source: Ambulatory Visit | Attending: Radiation Oncology | Admitting: Radiation Oncology

## 2022-05-22 ENCOUNTER — Inpatient Hospital Stay: Payer: 59 | Attending: Internal Medicine | Admitting: Internal Medicine

## 2022-05-22 ENCOUNTER — Encounter: Payer: Self-pay | Admitting: Internal Medicine

## 2022-05-22 DIAGNOSIS — Z7982 Long term (current) use of aspirin: Secondary | ICD-10-CM | POA: Insufficient documentation

## 2022-05-22 DIAGNOSIS — C50211 Malignant neoplasm of upper-inner quadrant of right female breast: Secondary | ICD-10-CM | POA: Insufficient documentation

## 2022-05-22 DIAGNOSIS — Z170 Estrogen receptor positive status [ER+]: Secondary | ICD-10-CM

## 2022-05-22 DIAGNOSIS — M2550 Pain in unspecified joint: Secondary | ICD-10-CM | POA: Insufficient documentation

## 2022-05-22 DIAGNOSIS — R232 Flushing: Secondary | ICD-10-CM | POA: Insufficient documentation

## 2022-05-22 DIAGNOSIS — M1990 Unspecified osteoarthritis, unspecified site: Secondary | ICD-10-CM | POA: Diagnosis not present

## 2022-05-22 DIAGNOSIS — Z79811 Long term (current) use of aromatase inhibitors: Secondary | ICD-10-CM | POA: Insufficient documentation

## 2022-05-22 DIAGNOSIS — Z79899 Other long term (current) drug therapy: Secondary | ICD-10-CM | POA: Insufficient documentation

## 2022-05-22 DIAGNOSIS — N951 Menopausal and female climacteric states: Secondary | ICD-10-CM | POA: Diagnosis not present

## 2022-05-22 DIAGNOSIS — Z923 Personal history of irradiation: Secondary | ICD-10-CM | POA: Insufficient documentation

## 2022-05-22 NOTE — Progress Notes (Signed)
[facility] [place] NOTE  Patient Care Team: [place], [facility] as PCP - General Cammie Sickle, MD as Consulting Physician (Internal Medicine) Herbert Pun, MD as Consulting Physician (General Surgery) Noreene Filbert, MD as Referring Physician (Radiation Oncology)  CHIEF COMPLAINTS/PURPOSE OF CONSULTATION: Breast cancer  #  Oncology History Overview Note  # OCT 2020-RIGHT BREAST, UPPER INNER QUADRANT;  INVASIVE MAMMARY CARCINOMA, NO SPECIAL TYPE. S/p Lumpectomy & SLNBx [Dr.Cintron]pT1b pN0; G-1; ER/PR >90%; her 2neu-NEG;   # feb 1st 2021- Arimidex  # survivorship: pending; LMP- 2011/ No TAH or BSO  DIAGNOSIS: Right breast cancer  STAGE:    1     ;  GOALS: Cure  CURRENT/MOST RECENT THERAPY : anastrazole    Carcinoma of upper-inner quadrant of right breast in female, estrogen receptor positive ([place])  06/07/2019 Initial Diagnosis   Carcinoma of upper-inner quadrant of right breast in female, estrogen receptor positive ([place])      HISTORY OF PRESENTING ILLNESS: Alone.  Independently.  Lisa Jennings 61 y.o.  female with above history of of right breast cancer stage I ER/PRpos; her2 NEG on arimidex is here for follow-up.  Patient complains of worsening joint pains bone pain.  This is interfering with her IADLs/work..  Otherwise no back pain.  She continues to be compliant with her anastrozole.  Complains of mild hot flashes.  Review of Systems  Constitutional:  Negative for chills, diaphoresis, fever, malaise/fatigue and weight loss.  HENT:  Negative for nosebleeds and sore throat.   Eyes:  Negative for double vision.  Respiratory:  Negative for cough, hemoptysis, sputum production, shortness of breath and wheezing.   Cardiovascular:  Negative for chest pain, palpitations, orthopnea and leg swelling.  Gastrointestinal:  Negative for abdominal pain, blood in stool, constipation, diarrhea, heartburn, melena, nausea and vomiting.   Genitourinary:  Negative for dysuria, frequency and urgency.  Musculoskeletal:  Positive for back pain and joint pain.  Skin: Negative.  Negative for itching and rash.  Neurological:  Negative for dizziness, tingling, focal weakness, weakness and headaches.  Endo/Heme/Allergies:  Does not bruise/bleed easily.  Psychiatric/Behavioral:  Negative for depression. The patient is not nervous/anxious and does not have insomnia.      MEDICAL HISTORY:  Past Medical History:  Diagnosis Date   Breast cancer ([place])    Carcinoma of upper-inner quadrant of right breast in female, estrogen receptor positive ([place]) 06/07/2019   Dyspnea    Family history of adverse reaction to anesthesia    nausea and vomiting   GERD (gastroesophageal reflux disease)    Headache    History of kidney stones    Hypertension    Personal history of radiation therapy 2020   Right lumpectomy   Pneumonia     SURGICAL HISTORY: Past Surgical History:  Procedure Laterality Date   APPENDECTOMY     BREAST BIOPSY Right 2016   neg/ bx -clip   BREAST BIOPSY Right 05/28/2019   x clip, stereo bx, invasive mammary carcinoma    BREAST LUMPECTOMY Right 06/11/2019   INVASIVE MAMMARY CARCINOMA   LITHOTRIPSY     PARTIAL MASTECTOMY WITH NEEDLE LOCALIZATION AND AXILLARY SENTINEL LYMPH NODE BX Right 06/11/2019   Procedure: PARTIAL MASTECTOMY WITH NEEDLE LOCALIZATION AND AXILLARY SENTINEL LYMPH NODE BX;  Surgeon: Herbert Pun, MD;  Location: [facility] ORS;  Service: General;  Laterality: Right;   REPLACEMENT TOTAL KNEE Right 03/21/2021   TUBAL LIGATION     WRIST FRACTURE SURGERY      SOCIAL HISTORY: Social History  Socioeconomic History   Marital status: Divorced    Spouse name: Not on file   Number of children: Not on file   Years of education: Not on file   Highest education level: Not on file  Occupational History   Not on file  Tobacco Use   Smoking status: Never   Smokeless tobacco: Never  Vaping Use   Vaping  Use: Never used  Substance and Sexual Activity   Alcohol use: Never   Drug use: Never   Sexual activity: Not Currently  Other Topics Concern   Not on file  Social History Narrative   Lives in [place]/ with brother/ son& family. 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    Social Determinants of Health   Financial Resource Strain: Not on file  Food Insecurity: Not on file  Transportation Needs: Not on file  Physical Activity: Not on file  Stress: Not on file  Social Connections: Not on file  Intimate Partner Violence: Not on file    FAMILY HISTORY: Family History  Problem Relation Age of Onset   Breast cancer Maternal Aunt 64   Diabetes Mother     ALLERGIES:  has No Known Allergies.  MEDICATIONS:  Current Outpatient Medications  Medication Sig Dispense Refill   acetaminophen (TYLENOL) 500 MG tablet Take 1,000 mg by mouth every 6 (six) hours as needed for moderate pain.     albuterol (VENTOLIN HFA) 108 (90 Base) MCG/ACT inhaler INHALE 1 TO 2 PUFFS INTO THE LUNGS EVERY 6 HOURS AS NEEDED FOR WHEEZING OR SHORTNESS OF BREATH 6.7 g 0   anastrozole (ARIMIDEX) 1 MG tablet Take 1 tablet (1 mg total) by mouth daily. 90 tablet 1   aspirin EC 81 MG tablet Take 81 mg by mouth daily.     Boswellia-Glucosamine-Vit D (OSTEO BI-FLEX ONE PER DAY PO) Take 1 tablet by mouth daily at 12 noon.     Calcium Carb-Cholecalciferol (CALCIUM 600+D) 600-800 MG-UNIT TABS Take 1 tablet by mouth daily.     Multiple Vitamins-Minerals (CENTRUM SILVER ADULT 50+ PO) Take 1 tablet by mouth daily.     valsartan-hydrochlorothiazide (DIOVAN-HCT) 160-12.5 MG tablet Take 1 tablet by mouth daily.     amoxicillin-clavulanate (AUGMENTIN) 875-125 MG tablet Take 1 tablet by mouth every 12 (twelve) hours. 14 tablet 0   meloxicam (MOBIC) 15 MG tablet Take 15 mg by mouth daily. (Patient not taking: Reported on 05/22/2022)     No current facility-administered medications for this visit.      Marland Kitchen  PHYSICAL  EXAMINATION: ECOG PERFORMANCE STATUS: 0 - Asymptomatic  Vitals:   05/22/22 1000  BP: (!) 140/78  Pulse: 81  Resp: 18  Temp: 98.6 F (37 C)   Filed Weights   05/22/22 1000  Weight: 241 lb (109.3 kg)    Physical Exam HENT:     Head: Normocephalic and atraumatic.     Mouth/Throat:     Pharynx: No oropharyngeal exudate.  Eyes:     Pupils: Pupils are equal, round, and reactive to light.  Cardiovascular:     Rate and Rhythm: Normal rate and regular rhythm.  Pulmonary:     Effort: No respiratory distress.     Breath sounds: No wheezing.  Abdominal:     General: Bowel sounds are normal. There is no distension.     Palpations: Abdomen is soft. There is no mass.     Tenderness: There is no abdominal tenderness. There is no guarding or rebound.  Musculoskeletal:  General: No tenderness. Normal range of motion.     Cervical back: Normal range of motion and neck supple.  Skin:    General: Skin is warm.  Neurological:     Mental Status: She is alert and oriented to person, place, and time.  Psychiatric:        Mood and Affect: Affect normal.      LABORATORY DATA:  I have reviewed the data as listed Lab Results  Component Value Date   WBC 7.0 09/08/2019   HGB 12.9 09/08/2019   HCT 40.3 09/08/2019   MCV 92.4 09/08/2019   PLT 257 09/08/2019   No results for input(s): "NA", "K", "CL", "CO2", "GLUCOSE", "BUN", "CREATININE", "CALCIUM", "GFRNONAA", "GFRAA", "PROT", "ALBUMIN", "AST", "ALT", "ALKPHOS", "BILITOT", "BILIDIR", "IBILI" in the last 8760 hours.  RADIOGRAPHIC STUDIES: I have personally reviewed the radiological images as listed and agreed with the findings in the report. No results found.  ASSESSMENT & PLAN:   Carcinoma of upper-inner quadrant of right breast in female, estrogen receptor positive ([place]) #  Stage I ER PR positive HER-2 negative breast cancer- On Arimidex. STABLE  Mammo in October 2022; Await mammo-Dr.Cintron/oct 2022- -reviewed; within normal  limits. On anatrazole [since FEB 2021 ] toleratng poorly- will hold for 1 month; and then re-assess for alteratives like Aromasin/letrozole.   # Arthritis- sec  AI-G-2-3; worse- recommend HOLDING Anastrazole given worsening MSK symptoms.   # Hot flashes G-1-sec to Anastrazole; monitor for now- STABLE.   # JAN 2021 BMD- T score+ 0.-4; Normal-continue calcium plus vitamin D. Will repeat BMD in 2024 STABLE.  # DISPOSITION:  # Follow up in 4-6 weeks- MD; no labs- Dr.B  All questions were answered. The patient/family knows to call the clinic with any problems, questions or concerns.    Cammie Sickle, MD 05/22/2022 10:36 AM

## 2022-05-22 NOTE — Progress Notes (Signed)
Patient aches from "head to toe" and not sure if related to the Anastrozole.

## 2022-05-22 NOTE — Progress Notes (Signed)
Radiation Oncology Follow up Note  Name: Lisa Jennings   Date:   05/22/2022 MRN:  [id number] DOB: 10-15-1960    This 61 y.o. female presents to the clinic today for 2-1/2-year follow-up status post whole breast radiation to her right breast for stage I ER/PR positive invasive mammary carcinoma.  REFERRING PROVIDER: Hortencia Pilar, MD  HPI: Patient is a 60 year old female now out 2-1/2 years having completed whole breast radiation to her right breast for stage I (T1b N0 M0) ER/PR positive invasive mammary carcinoma.  Seen today in routine follow-up she is doing well specifically denies breast tenderness cough.  She has been having some exacerbation of joint pain her endocrine therapy is on hold.  She is scheduled for mammogram next week  COMPLICATIONS OF TREATMENT: none  FOLLOW UP COMPLIANCE: keeps appointments   PHYSICAL EXAM:  There were no vitals taken for this visit. Lungs are clear to A&P cardiac examination essentially unremarkable with regular rate and rhythm. No dominant mass or nodularity is noted in either breast in 2 positions examined. Incision is well-healed. No axillary or supraclavicular adenopathy is appreciated. Cosmetic result is excellent.  Well-developed well-nourished patient in NAD. HEENT reveals PERLA, EOMI, discs not visualized.  Oral cavity is clear. No oral mucosal lesions are identified. Neck is clear without evidence of cervical or supraclavicular adenopathy. Lungs are clear to A&P. Cardiac examination is essentially unremarkable with regular rate and rhythm without murmur rub or thrill. Abdomen is benign with no organomegaly or masses noted. Motor sensory and DTR levels are equal and symmetric in the upper and lower extremities. Cranial nerves II through XII are grossly intact. Proprioception is intact. No peripheral adenopathy or edema is identified. No motor or sensory levels are noted. Crude visual fields are within normal range.  RADIOLOGY RESULTS:  Mammogram will be reviewed once available next week  PLAN: Present time patient is doing well no evidence of disease now out 2 and half years.  And pleased with her overall progress I have asked to see her back in 1 year for follow-up and then will discontinue follow-up care.  Patient is to call with any concerns.  I would like to take this opportunity to thank you for allowing me to participate in the care of your patient.Noreene Filbert, MD

## 2022-05-22 NOTE — Patient Instructions (Signed)
#  Hold anastrozole until next visit.

## 2022-05-22 NOTE — Assessment & Plan Note (Addendum)
#  Stage I ER PR positive HER-2 negative breast cancer- On Arimidex. STABLE  Mammo in October 2022; Await mammo-Dr.Cintron/oct 2022- -reviewed; within normal limits. On anatrazole [since FEB 2021 ] toleratng poorly- will hold for 1 month; and then re-assess for alteratives like Aromasin/letrozole.  No concerns for any progressive disease.  However if symptoms not resolved next visit would recommend bone scan/further work-up.  # Arthritis- sec  AI-G-2-3; worse- recommend HOLDING Anastrazole given worsening MSK symptoms.  See above.  # Hot flashes G-1-sec to Anastrazole; monitor for now- STABLE.   # JAN 2021 BMD- T score+ 0.-4; Normal-continue calcium plus vitamin D. Will repeat BMD in 2024 STABLE.  # DISPOSITION:  # Follow up in 4-6 weeks- MD; no labs- Dr.B

## 2022-05-30 ENCOUNTER — Ambulatory Visit
Admission: RE | Admit: 2022-05-30 | Discharge: 2022-05-30 | Disposition: A | Discharge status: Home / Self Care | Payer: 59 | Source: Ambulatory Visit | Attending: General Surgery | Admitting: General Surgery

## 2022-05-30 DIAGNOSIS — Z1231 Encounter for screening mammogram for malignant neoplasm of breast: Secondary | ICD-10-CM | POA: Diagnosis present

## 2022-06-03 ENCOUNTER — Other Ambulatory Visit: Payer: Self-pay | Admitting: General Surgery

## 2022-06-03 DIAGNOSIS — R928 Other abnormal and inconclusive findings on diagnostic imaging of breast: Secondary | ICD-10-CM

## 2022-06-03 DIAGNOSIS — N63 Unspecified lump in unspecified breast: Secondary | ICD-10-CM

## 2022-06-06 ENCOUNTER — Ambulatory Visit
Admission: RE | Admit: 2022-06-06 | Discharge: 2022-06-06 | Disposition: A | Discharge status: Home / Self Care | Payer: 59 | Source: Ambulatory Visit | Attending: General Surgery | Admitting: General Surgery

## 2022-06-06 DIAGNOSIS — R928 Other abnormal and inconclusive findings on diagnostic imaging of breast: Secondary | ICD-10-CM | POA: Insufficient documentation

## 2022-06-06 DIAGNOSIS — N63 Unspecified lump in unspecified breast: Secondary | ICD-10-CM | POA: Diagnosis present

## 2022-06-21 ENCOUNTER — Other Ambulatory Visit: Payer: 59

## 2022-06-26 ENCOUNTER — Encounter: Payer: Self-pay | Admitting: Internal Medicine

## 2022-06-26 ENCOUNTER — Inpatient Hospital Stay: Payer: 59 | Attending: Internal Medicine | Admitting: Internal Medicine

## 2022-06-26 DIAGNOSIS — R232 Flushing: Secondary | ICD-10-CM | POA: Diagnosis not present

## 2022-06-26 DIAGNOSIS — Z170 Estrogen receptor positive status [ER+]: Secondary | ICD-10-CM

## 2022-06-26 DIAGNOSIS — M1990 Unspecified osteoarthritis, unspecified site: Secondary | ICD-10-CM

## 2022-06-26 DIAGNOSIS — Z79811 Long term (current) use of aromatase inhibitors: Secondary | ICD-10-CM

## 2022-06-26 DIAGNOSIS — C50211 Malignant neoplasm of upper-inner quadrant of right female breast: Secondary | ICD-10-CM | POA: Diagnosis not present

## 2022-06-26 MED ORDER — EXEMESTANE 25 MG PO TABS: 25.0000 mg | ORAL_TABLET | Freq: Every day | ORAL | 1 refills | Status: DC

## 2022-06-26 NOTE — Progress Notes (Signed)
Pt in for follow up, reports she saw surgeon and had breast exam.  Pt states she is still not taking Anastrozole as discussed because of severe body pain and aches.  Pt reports this has resolved.

## 2022-06-26 NOTE — Assessment & Plan Note (Addendum)
#  Stage I ER PR positive HER-2 negative breast cancer- Mammo-Dr.Cintron/oct 2023- -reviewed; within normal limits except for seroma.  Patient currently off adjuvant anastrozole because of musculoskeletal symptoms.  # Given the intolerance to anastrozole; and since resolution of symptoms will switch to alternative AI-recommend exemestane.  Sent the prescription to Mail in pharmacy.  # Arthritis- sec  AI-G-1-improved after HOLDING Anastrazole given worsening MSK symptoms; monitor closely on exemestane.  # Hot flashes G-1-sec to Anastrazole; monitor for now- STABLE.   # JAN 2021 BMD- T score+ 0.-4; Normal-continue calcium plus vitamin D. Will repeat BMD in 2024 STABLE.  # DISPOSITION:  # Follow up in 3 months- MD; no labs- Dr.B

## 2022-06-26 NOTE — Progress Notes (Signed)
I connected with [name] on 06/26/22 at 10:45 AM EDT by video enabled telemedicine visit and verified that I am speaking with the correct person using two identifiers.  I discussed the limitations, risks, security and privacy concerns of performing an evaluation and management service by telemedicine and the availability of in-person appointments. I also discussed with the patient that there may be a patient responsible charge related to this service. The patient expressed understanding and agreed to proceed.    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 S/p Lumpectomy & SLNBx [Dr.Cintron]pT1b pN0; G-1; ER/PR >90%; her 2neu-NEG;   # feb 1st 2021- Arimidex; STOPPED OCT 2023; sec to MSK- NOV Started Examestane  # survivorship: pending; LMP- 2011/ No TAH or BSO  DIAGNOSIS: Right breast cancer  STAGE:    1     ;  GOALS: Cure     Carcinoma of upper-inner quadrant of right breast in female, estrogen receptor positive ([name])  06/07/2019 Initial Diagnosis   Carcinoma of upper-inner quadrant of right breast in female, estrogen receptor positive ([name])     Chief Complaint: breast cancer   History of present illness:Lisa Jennings 61 y.o.  female with history of stage I ER/PR positive HER2 negative breast cancer is here for follow-up.  Pt states she is still not taking Anastrozole as discussed because of severe body pain and aches.  Pt reports this has resolved.   Pt in for follow up, reports she saw surgeon and had breast exam.    Observation/objective: Alert & oriented x 3. In No acute distress.   Assessment and plan: Carcinoma of upper-inner quadrant of right breast in female, estrogen receptor positive ([place]) #  Stage I ER PR  positive HER-2 negative breast cancer- Mammo-Dr.Cintron/oct 2023- -reviewed; within normal limits except for seroma.  Patient currently off adjuvant anastrozole because of musculoskeletal symptoms.  # Given the intolerance to anastrozole; and since resolution of symptoms will switch to alternative AI-recommend exemestane.  Sent the prescription to Mail in pharmacy.  # Arthritis- sec  AI-G-1-improved after HOLDING Anastrazole given worsening MSK symptoms; monitor closely on exemestane.  # Hot flashes G-1-sec to Anastrazole; monitor for now- STABLE.   # JAN 2021 BMD- T score+ 0.-4; Normal-continue calcium plus vitamin D. Will repeat BMD in 2024 STABLE.  # DISPOSITION:  # Follow up in 3 months- MD; no labs- Dr.B    Follow-up instructions:  I discussed the assessment and treatment plan with the patient.  The patient was provided an opportunity to ask questions and all were answered.  The patient agreed with the plan and demonstrated understanding of instructions.  The patient was advised to call back or seek an in person evaluation if the symptoms worsen or if the condition fails to improve as anticipated.    Dr. Charlaine Dalton [place] at [facility] 06/26/2022 12:25 PM

## 2022-09-08 ENCOUNTER — Ambulatory Visit
Admission: EM | Admit: 2022-09-08 | Discharge: 2022-09-08 | Disposition: A | Discharge status: Home / Self Care | Payer: 59 | Attending: Physician Assistant | Admitting: Physician Assistant

## 2022-09-08 ENCOUNTER — Ambulatory Visit (INDEPENDENT_AMBULATORY_CARE_PROVIDER_SITE_OTHER): Payer: 59

## 2022-09-08 DIAGNOSIS — S39012A Strain of muscle, fascia and tendon of lower back, initial encounter: Secondary | ICD-10-CM | POA: Diagnosis not present

## 2022-09-08 DIAGNOSIS — M545 Low back pain, unspecified: Secondary | ICD-10-CM | POA: Diagnosis not present

## 2022-09-08 MED ORDER — METHOCARBAMOL 500 MG PO TABS: 500.0000 mg | ORAL_TABLET | Freq: Two times a day (BID) | ORAL | 0 refills | Status: DC

## 2022-09-08 MED ORDER — PREDNISONE 10 MG (21) PO TBPK: ORAL_TABLET | ORAL | 0 refills | Status: DC

## 2022-09-08 NOTE — ED Provider Notes (Signed)
[facility]    CSN: [id number] Arrival date & time: 09/08/22  0854      History   Chief Complaint Chief Complaint  Patient presents with   Back Pain    HPI Lisa Jennings is a 62 y.o. female.   Patient presents today with a 24-hour history of severe lower back pain.  Reports that yesterday she was bending over rinsing out a wash rag in the shower when she felt a pop and had very severe pain in her lower back.  She has had ongoing pain since that time that has improved minimally.  She reports pain is rated 10 on a 0-10 pain scale, described as throbbing/sharp, worse with certain movements or changing position, no alleviating factors identified.  She denies any previous surgery or injury of her back.  She denies bowel/bladder continence, lower extremity weakness, saddle anesthesia.  She does have a remote history of malignancy.  She has tried over-the-counter analgesics without improvement of symptoms.    Past Medical History:  Diagnosis Date   Breast cancer ([place])    Carcinoma of upper-inner quadrant of right breast in female, estrogen receptor positive ([place]) 06/07/2019   Dyspnea    Family history of adverse reaction to anesthesia    nausea and vomiting   GERD (gastroesophageal reflux disease)    Headache    History of kidney stones    Hypertension    Personal history of radiation therapy 2020   Right lumpectomy   Pneumonia     Patient Active Problem List   Diagnosis Date Noted   BMI 38.0-38.9,adult 03/28/2020   Carcinoma of upper-inner quadrant of right breast in female, estrogen receptor positive ([place]) 06/07/2019   Essential hypertension 05/26/2018   History of kidney stones 05/25/2015   Trigger middle finger of left hand 08/12/2014   Joint stiffness of hand 05/27/2013   Impingement syndrome of right shoulder 07/10/2012   Lateral epicondylitis 05/08/2012   Shoulder pain 05/08/2012    Past Surgical History:  Procedure Laterality Date    APPENDECTOMY     BREAST BIOPSY Right 2016   neg/ bx -clip   BREAST BIOPSY Right 05/28/2019   x clip, stereo bx, invasive mammary carcinoma    BREAST LUMPECTOMY Right 06/11/2019   INVASIVE MAMMARY CARCINOMA   LITHOTRIPSY     PARTIAL MASTECTOMY WITH NEEDLE LOCALIZATION AND AXILLARY SENTINEL LYMPH NODE BX Right 06/11/2019   Procedure: PARTIAL MASTECTOMY WITH NEEDLE LOCALIZATION AND AXILLARY SENTINEL LYMPH NODE BX;  Surgeon: Herbert Pun, MD;  Location: [facility] ORS;  Service: General;  Laterality: Right;   REPLACEMENT TOTAL KNEE Right 03/21/2021   TUBAL LIGATION     WRIST FRACTURE SURGERY      OB History   No obstetric history on file.      Home Medications    Prior to Admission medications   Medication Sig Start Date End Date Taking? Authorizing Provider  methocarbamol (ROBAXIN) 500 MG tablet Take 1 tablet (500 mg total) by mouth 2 (two) times daily. 09/08/22  Yes Nickie Warwick K, PA-C  predniSONE (STERAPRED UNI-PAK 21 TAB) 10 MG (21) TBPK tablet As directed 09/08/22  Yes Darragh Nay K, PA-C  acetaminophen (TYLENOL) 500 MG tablet Take 1,000 mg by mouth every 6 (six) hours as needed for moderate pain.    [provider]  albuterol (VENTOLIN HFA) 108 (90 Base) MCG/ACT inhaler INHALE 1 TO 2 PUFFS INTO THE LUNGS EVERY 6 HOURS AS NEEDED FOR WHEEZING OR SHORTNESS OF BREATH 04/10/22   Nils Pyle,  Kriste Basque, NP  aspirin EC 81 MG tablet Take 81 mg by mouth daily.    [provider]  Boswellia-Glucosamine-Vit D (OSTEO BI-FLEX ONE PER DAY PO) Take 1 tablet by mouth daily at 12 noon.    [provider]  Calcium Carb-Cholecalciferol (CALCIUM 600+D) 600-800 MG-UNIT TABS Take 1 tablet by mouth daily.    [provider]  exemestane (AROMASIN) 25 MG tablet Take 1 tablet (25 mg total) by mouth daily after breakfast. 06/26/22   Cammie Sickle, MD  meloxicam (MOBIC) 15 MG tablet Take 15 mg by mouth daily. 04/07/19   [provider]  Multiple  Vitamins-Minerals (CENTRUM SILVER ADULT 50+ PO) Take 1 tablet by mouth daily.    [provider]  valsartan-hydrochlorothiazide (DIOVAN-HCT) 160-12.5 MG tablet Take 1 tablet by mouth daily. 05/06/19   [provider]    Family History Family History  Problem Relation Age of Onset   Breast cancer Maternal Aunt 64   Diabetes Mother     Social History Social History   Tobacco Use   Smoking status: Never   Smokeless tobacco: Never  Vaping Use   Vaping Use: Never used  Substance Use Topics   Alcohol use: Never   Drug use: Never     Allergies   Patient has no known allergies.   Review of Systems Review of Systems  Constitutional:  Negative for activity change, appetite change, fatigue and fever.  Genitourinary:  Negative for difficulty urinating and enuresis.  Musculoskeletal:  Positive for back pain. Negative for arthralgias and myalgias.  Neurological:  Negative for weakness and numbness.     Physical Exam Triage Vital Signs ED Triage Vitals  Enc Vitals Group     BP 09/08/22 0915 (!) 141/63     Pulse Rate 09/08/22 0915 72     Resp 09/08/22 0915 18     Temp 09/08/22 0915 98 F (36.7 C)     Temp Source 09/08/22 0915 Oral     SpO2 09/08/22 0915 98 %     Weight 09/08/22 0911 234 lb (106.1 kg)     Height 09/08/22 0911 '5\' 4"'$  (1.626 m)     Head Circumference --      Peak Flow --      Pain Score 09/08/22 0911 10     Pain Loc --      Pain Edu? --      Excl. in [place]? --    No data found.  Updated Vital Signs BP (!) 141/63 (BP Location: Left Arm)   Pulse 72   Temp 98 F (36.7 C) (Oral)   Resp 18   Ht '5\' 4"'$  (1.626 m)   Wt 234 lb (106.1 kg)   SpO2 98%   BMI 40.17 kg/m   Visual Acuity Right Eye Distance:   Left Eye Distance:   Bilateral Distance:    Right Eye Near:   Left Eye Near:    Bilateral Near:     Physical Exam Vitals reviewed.  Constitutional:      General: She is awake. She is not in acute distress.    Appearance: Normal  appearance. She is well-developed. She is not ill-appearing.     Comments: Very pleasant female appears stated age in no acute distress sitting comfortably in exam room  HENT:     Head: Normocephalic and atraumatic.  Cardiovascular:     Rate and Rhythm: Normal rate and regular rhythm.     Heart sounds: Normal heart sounds, S1 normal  and S2 normal. No murmur heard. Pulmonary:     Effort: Pulmonary effort is normal.     Breath sounds: Normal breath sounds. No wheezing, rhonchi or rales.     Comments: Clear to auscultation bilaterally Musculoskeletal:     Cervical back: No tenderness or bony tenderness.     Thoracic back: Tenderness present. No bony tenderness.     Lumbar back: Tenderness and bony tenderness present. Decreased range of motion. Negative right straight leg raise test and negative left straight leg raise test.     Comments: Back: Pain with percussion of lumbar vertebrae.  No deformity or step-off noted.  Decreased range of motion with forward flexion, rotation, lateral flexion.  Tenderness palpation of thoracic and lumbar paraspinal muscles.  Strength 5/5 bilateral lower extremities.  Psychiatric:        Behavior: Behavior is cooperative.      UC Treatments / Results  Labs (all labs ordered are listed, but only abnormal results are displayed) Labs Reviewed - No data to display  EKG   Radiology DG Lumbar Spine Complete  Result Date: 09/08/2022 CLINICAL DATA:  Patient experiencing back pain after bending over and hearing a "pop". Patient complains of constant pain at this time. HX arthritis. EXAM: LUMBAR SPINE - COMPLETE 4+ VIEW COMPARISON:  None Available. FINDINGS: Motion limits study. Normal alignment. Vertebral body heights are maintained. Multilevel intervertebral disc space loss and osteophyte formation. No focal bone lesion. SI joints are open and symmetric. Nonobstructive bowel gas pattern. IMPRESSION: 1. No acute osseous abnormality of the lumbar spine. Study is  somewhat limited by motion. 2. Multilevel degenerative disc disease. Electronically Signed   By: Audie Pinto M.D.   On: 09/08/2022 09:56    Procedures Procedures (including critical care time)  Medications Ordered in UC Medications - No data to display  Initial Impression / Assessment and Plan / UC Course  I have reviewed the triage vital signs and the nursing notes.  Pertinent labs & imaging results that were available during my care of the patient were reviewed by me and considered in my medical decision making (see chart for details).     X-ray was obtained given severity of pain as well as history of malignancy which showed no acute osseous abnormality but did show degenerative disc disease.  Discussed likely strain as etiology of symptoms.  She was started on Robaxin up to twice a day.  Discussed that this can be sedating and she is not to drive or drink alcohol with taking it.  Will start prednisone taper.  Discussed that she is not to take NSAIDs with this medication including aspirin, ibuprofen/Advil, naproxen/Aleve, prescribed meloxicam/Mobic.  She can use acetaminophen/Tylenol for breakthrough pain.  She can use heat, rest, stretch.  Discussed that if her symptoms are not improving she should follow-up with orthopedics for further evaluation including potentially advanced imaging since we are not able to arrange this in urgent care.  She was given contact information for local provider with instruction call to schedule appointment.  Discussed that if she has any worsening or changing symptoms including increasing pain, lower extremity weakness, saddle anesthesia, bowel/bladder incontinence she is to go to the emergency room.  Strict return precautions given.  Work excuse note provided.  Final Clinical Impressions(s) / UC Diagnoses   Final diagnoses:  Low back strain, initial encounter  Acute bilateral low back pain without sciatica     Discharge Instructions      Your  x-ray showed arthritis but no obvious  fracture or dislocation which is great news.  Start Robaxin twice a day to help with the pain.  This will make you sleepy so do not drive or drink alcohol while taking it.  Start prednisone taper.  This will help with pain and inflammation.  Do not take NSAIDs with this including aspirin, ibuprofen/Advil, naproxen/Aleve, you are prescribed meloxicam/Mobic.  You can use acetaminophen/Tylenol.  Use a heating pad and gentle stretch.  If your symptoms are proving please follow-up with orthopedic provider for further evaluation and management; call to schedule an appointment.  If anything worsens and you have worsening pain, difficulty walking, numbness on the inside of your legs, going to the bathroom on yourself without noticing it you need to go to the emergency room.     ED Prescriptions     Medication Sig Dispense Auth. Provider   methocarbamol (ROBAXIN) 500 MG tablet Take 1 tablet (500 mg total) by mouth 2 (two) times daily. 20 tablet Aerilynn Goin K, PA-C   predniSONE (STERAPRED UNI-PAK 21 TAB) 10 MG (21) TBPK tablet As directed 21 tablet Rowyn Spilde K, PA-C      PDMP not reviewed this encounter.   Terrilee Croak, PA-C 09/08/22 1019

## 2022-09-08 NOTE — ED Triage Notes (Signed)
Patient presents with lower back pain. Patient states she was bent over and feels like something popped and when she attempted to get out of the tub, she could not get out.   Pt states she have not experience this pain before.

## 2022-09-08 NOTE — Discharge Instructions (Signed)
Your x-ray showed arthritis but no obvious fracture or dislocation which is great news.  Start Robaxin twice a day to help with the pain.  This will make you sleepy so do not drive or drink alcohol while taking it.  Start prednisone taper.  This will help with pain and inflammation.  Do not take NSAIDs with this including aspirin, ibuprofen/Advil, naproxen/Aleve, you are prescribed meloxicam/Mobic.  You can use acetaminophen/Tylenol.  Use a heating pad and gentle stretch.  If your symptoms are proving please follow-up with orthopedic provider for further evaluation and management; call to schedule an appointment.  If anything worsens and you have worsening pain, difficulty walking, numbness on the inside of your legs, going to the bathroom on yourself without noticing it you need to go to the emergency room.

## 2022-09-23 ENCOUNTER — Emergency Department: Payer: No Typology Code available for payment source

## 2022-09-23 ENCOUNTER — Emergency Department
Admission: EM | Admit: 2022-09-23 | Discharge: 2022-09-23 | Disposition: A | Discharge status: Home / Self Care | Payer: No Typology Code available for payment source | Attending: Emergency Medicine | Admitting: Emergency Medicine

## 2022-09-23 ENCOUNTER — Other Ambulatory Visit: Payer: 59

## 2022-09-23 DIAGNOSIS — W08XXXA Fall from other furniture, initial encounter: Secondary | ICD-10-CM | POA: Insufficient documentation

## 2022-09-23 DIAGNOSIS — W19XXXA Unspecified fall, initial encounter: Secondary | ICD-10-CM

## 2022-09-23 DIAGNOSIS — S59901A Unspecified injury of right elbow, initial encounter: Secondary | ICD-10-CM | POA: Insufficient documentation

## 2022-09-23 DIAGNOSIS — Y990 Civilian activity done for income or pay: Secondary | ICD-10-CM | POA: Diagnosis not present

## 2022-09-23 MED ORDER — TRAMADOL HCL 50 MG PO TABS: 50.0000 mg | ORAL_TABLET | Freq: Four times a day (QID) | ORAL | 0 refills | Status: AC | PRN

## 2022-09-23 NOTE — ED Triage Notes (Signed)
Pt reports she was at work, standing on bottom step of ladder and lost step and fell onto rt arm. Pt reports pain from middle of upper arm down. Pt able to bend elbow. Pt denies LOC/head injury.

## 2022-09-23 NOTE — ED Notes (Signed)
Rt hand neurovascular ck completed and WNL.

## 2022-09-23 NOTE — ED Provider Notes (Signed)
[facility] Provider Note    Event Date/Time   First MD Initiated Contact with Patient 09/23/22 [phone]     (approximate)   History   Arm Injury and Fall   HPI  Jilliane Juli Odom is a 62 y.o. female who presents to the emergency department today because of concerns for right arm pain after a fall.  [place] off of a stepstool.  Having pain from her mid right upper arm through her fingers.  Does hurt worse to move it.  She denies hitting her head with the fall.  Did also hit her buttocks but denies any significant pain there.     Physical Exam   Triage Vital Signs: ED Triage Vitals  Enc Vitals Group     BP 09/23/22 0246 (!) 152/84     Pulse Rate 09/23/22 0244 81     Resp 09/23/22 0244 16     Temp 09/23/22 0244 98 F (36.7 C)     Temp Source 09/23/22 0244 Oral     SpO2 09/23/22 0244 95 %     Weight 09/23/22 0245 233 lb 11 oz (106 kg)     Height 09/23/22 0245 '5\' 4"'$  (1.626 m)     Head Circumference --      Peak Flow --      Pain Score 09/23/22 0245 5     Pain Loc --      Pain Edu? --      Excl. in [place]? --     Most recent vital signs: Vitals:   09/23/22 0246 09/23/22 0255  BP: (!) 152/84   Pulse:    Resp:    Temp:    SpO2:  95%   General: Awake, alert, oriented. CV:  Good peripheral perfusion.  Resp:  Normal effort.  Abd:  No distention.  Other:  No deformity to the right arm. Radial pulse 2+. No discoloration. No tenderness to wrist or skin overlying scaphoid. Tender to palpation of the elbow.   ED Results / Procedures / Treatments   Labs (all labs ordered are listed, but only abnormal results are displayed) Labs Reviewed - No data to display   EKG  None   RADIOLOGY I independently interpreted and visualized the right elbow. My interpretation: No acute osseous abnormality Radiology interpretation:  IMPRESSION:  Elbow joint effusion, suggesting an occult radial head fracture.   I independently interpreted and visualized the  right wrist. My interpretation: No acute osseous abnormality Radiology interpretation:  IMPRESSION:  Possible nondisplaced fracture involving the distal scaphoid,  equivocal. Correlate for point tenderness.    I independently interpreted and visualized the right shoulder. My interpretation: No acute osseous abnormality Radiology interpretation:  IMPRESSION:  Negative.      PROCEDURES:  Critical Care performed: No  Procedures   MEDICATIONS ORDERED IN ED: Medications - No data to display   IMPRESSION / MDM / [place] / ED COURSE  I reviewed the triage vital signs and the nursing notes.                              Differential diagnosis includes, but is not limited to, fracture, dislocation, contusion  Patient's presentation is most consistent with acute presentation with potential threat to life or bodily function.   Patient presented to the emergency department today because of concerns for right arm pain after a fall.  On exam patient does have some tenderness around the elbow.  Right shoulder elbow and wrist x-rays were ordered from triage.  There is concern for occult right radial head fracture.  Given that this is where patient is having most of her pain will place in splint and treat as a right radial head fracture.  Did discuss importance of orthopedic follow-up.  Additionally right wrist x-ray there was concern for possible scaphoid fracture however patient has no tenderness to this area on exam.  I have low suspicion for fracture of the scaphoid.    FINAL CLINICAL IMPRESSION(S) / ED DIAGNOSES   Final diagnoses:  Fall, initial encounter  Elbow injury, right, initial encounter   Note:  This document was prepared using Dragon voice recognition software and may include unintentional dictation errors.    Nance Pear, MD 09/23/22 [phone]

## 2022-09-23 NOTE — Discharge Instructions (Addendum)
Please seek medical attention for any high fevers, chest pain, shortness of breath, change in behavior, persistent vomiting, bloody stool or any other new or concerning symptoms.  

## 2022-09-23 NOTE — ED Notes (Signed)
Pt workmans comp paperwork and swab completed at 0421 by this RN and Linus Orn NT.  Swab bagged and sealed in pt presence and walked to lab by this RN.

## 2022-09-27 ENCOUNTER — Inpatient Hospital Stay: Payer: 59 | Attending: Internal Medicine | Admitting: Internal Medicine

## 2022-09-27 VITALS — BP 108/67 | HR 83 | Temp 100.0°F | Resp 18 | Wt 240.0 lb

## 2022-09-27 DIAGNOSIS — Z170 Estrogen receptor positive status [ER+]: Secondary | ICD-10-CM | POA: Insufficient documentation

## 2022-09-27 DIAGNOSIS — Z79811 Long term (current) use of aromatase inhibitors: Secondary | ICD-10-CM | POA: Insufficient documentation

## 2022-09-27 DIAGNOSIS — R232 Flushing: Secondary | ICD-10-CM | POA: Insufficient documentation

## 2022-09-27 DIAGNOSIS — I10 Essential (primary) hypertension: Secondary | ICD-10-CM | POA: Insufficient documentation

## 2022-09-27 DIAGNOSIS — C50211 Malignant neoplasm of upper-inner quadrant of right female breast: Secondary | ICD-10-CM | POA: Insufficient documentation

## 2022-09-27 DIAGNOSIS — Z803 Family history of malignant neoplasm of breast: Secondary | ICD-10-CM | POA: Insufficient documentation

## 2022-09-27 NOTE — Progress Notes (Signed)
No Breast discomfort. Hot flashes from her AI. Energy is good. Appetite is good. Fell at work on 09/23/22 Fractured her right elbow and wrist. Arm is in ace wrap and sling. Pain is 3 out of 10 on pain scale.Awaiting appointment for orthopedics.

## 2022-09-27 NOTE — Progress Notes (Signed)
[facility] NOTE  Patient Care Team: [place], [facility] as PCP - General Cammie Sickle, MD as Consulting Physician (Internal Medicine) Herbert Pun, MD as Consulting Physician (General Surgery) Noreene Filbert, MD as Referring Physician (Radiation Oncology)  CHIEF COMPLAINTS/PURPOSE OF CONSULTATION: Breast cancer  #  Oncology History Overview Note  # OCT 2020-RIGHT BREAST, UPPER INNER QUADRANT;  INVASIVE MAMMARY CARCINOMA, NO SPECIAL TYPE. S/p Lumpectomy & SLNBx [Dr.Cintron]pT1b pN0; G-1; ER/PR >90%; her 2neu-NEG;   # feb 1st 2021- Arimidex; STOPPED OCT 2023; sec to [facility]- NOV Started Examestane  # survivorship: pending; LMP- 2011/ No TAH or BSO  DIAGNOSIS: Right breast cancer  STAGE:    1     ;  GOALS: Cure     Carcinoma of upper-inner quadrant of right breast in female, estrogen receptor positive ([place])  06/07/2019 Initial Diagnosis   Carcinoma of upper-inner quadrant of right breast in female, estrogen receptor positive ([place])      HISTORY OF PRESENTING ILLNESS: Alone.  Independently.  Lisa Jennings 62 y.o.  female with above history of of right breast cancer stage I ER/PRpos; her2 NEG on examestane is here for follow-up.  No Breast discomfort. Hot flashes from her AI. Energy is good. Appetite is good. She continues to be compliant with her examestane. Complains of mild hot flashes.  Unfortunately patient fell at work on 09/23/22 Fractured her right elbow and wrist. Arm is in ace wrap and sling. Pain is 3 out of 10 on pain scale. Awaiting appointment for orthopedics.  Otherwise denies worsening pain in the joints.    Review of Systems  Constitutional:  Negative for chills, diaphoresis, fever, malaise/fatigue and weight loss.  HENT:  Negative for nosebleeds and sore throat.   Eyes:  Negative for double vision.  Respiratory:  Negative for cough, hemoptysis, sputum production, shortness of breath and wheezing.    Cardiovascular:  Negative for chest pain, palpitations, orthopnea and leg swelling.  Gastrointestinal:  Negative for abdominal pain, blood in stool, constipation, diarrhea, heartburn, melena, nausea and vomiting.  Genitourinary:  Negative for dysuria, frequency and urgency.  Musculoskeletal:  Positive for back pain and joint pain.  Skin: Negative.  Negative for itching and rash.  Neurological:  Negative for dizziness, tingling, focal weakness, weakness and headaches.  Endo/Heme/Allergies:  Does not bruise/bleed easily.  Psychiatric/Behavioral:  Negative for depression. The patient is not nervous/anxious and does not have insomnia.      MEDICAL HISTORY:  Past Medical History:  Diagnosis Date   Breast cancer ([place])    Carcinoma of upper-inner quadrant of right breast in female, estrogen receptor positive ([place]) 06/07/2019   Dyspnea    Family history of adverse reaction to anesthesia    nausea and vomiting   GERD (gastroesophageal reflux disease)    Headache    History of kidney stones    Hypertension    Personal history of radiation therapy 2020   Right lumpectomy   Pneumonia     SURGICAL HISTORY: Past Surgical History:  Procedure Laterality Date   APPENDECTOMY     BREAST BIOPSY Right 2016   neg/ bx -clip   BREAST BIOPSY Right 05/28/2019   x clip, stereo bx, invasive mammary carcinoma    BREAST LUMPECTOMY Right 06/11/2019   INVASIVE MAMMARY CARCINOMA   LITHOTRIPSY     PARTIAL MASTECTOMY WITH NEEDLE LOCALIZATION AND AXILLARY SENTINEL LYMPH NODE BX Right 06/11/2019   Procedure: PARTIAL MASTECTOMY WITH NEEDLE LOCALIZATION AND AXILLARY SENTINEL LYMPH NODE BX;  Surgeon:  Herbert Pun, MD;  Location: [facility] ORS;  Service: General;  Laterality: Right;   REPLACEMENT TOTAL KNEE Right 03/21/2021   TUBAL LIGATION     WRIST FRACTURE SURGERY      SOCIAL HISTORY: Social History   Socioeconomic History   Marital status: Divorced    Spouse name: Not on file   Number of  children: Not on file   Years of education: Not on file   Highest education level: Not on file  Occupational History   Not on file  Tobacco Use   Smoking status: Never   Smokeless tobacco: Never  Vaping Use   Vaping Use: Never used  Substance and Sexual Activity   Alcohol use: Never   Drug use: Never   Sexual activity: Not Currently  Other Topics Concern   Not on file  Social History Narrative   Lives in [place]/ with brother/ son& family. 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    Social Determinants of Health   Financial Resource Strain: Not on file  Food Insecurity: Not on file  Transportation Needs: Not on file  Physical Activity: Not on file  Stress: Not on file  Social Connections: Not on file  Intimate Partner Violence: Not on file    FAMILY HISTORY: Family History  Problem Relation Age of Onset   Breast cancer Maternal Aunt 64   Diabetes Mother     ALLERGIES:  has No Known Allergies.  MEDICATIONS:  Current Outpatient Medications  Medication Sig Dispense Refill   acetaminophen (TYLENOL) 500 MG tablet Take 1,000 mg by mouth every 6 (six) hours as needed for moderate pain.     aspirin EC 81 MG tablet Take 81 mg by mouth daily.     Boswellia-Glucosamine-Vit D (OSTEO BI-FLEX ONE PER DAY PO) Take 1 tablet by mouth daily at 12 noon.     Calcium Carb-Cholecalciferol (CALCIUM 600+D) 600-800 MG-UNIT TABS Take 1 tablet by mouth daily.     exemestane (AROMASIN) 25 MG tablet Take 1 tablet (25 mg total) by mouth daily after breakfast. 90 tablet 1   meloxicam (MOBIC) 15 MG tablet Take 15 mg by mouth daily.     Multiple Vitamins-Minerals (CENTRUM SILVER ADULT 50+ PO) Take 1 tablet by mouth daily.     traMADol (ULTRAM) 50 MG tablet Take 1 tablet (50 mg total) by mouth every 6 (six) hours as needed. 15 tablet 0   valsartan-hydrochlorothiazide (DIOVAN-HCT) 160-12.5 MG tablet Take 1 tablet by mouth daily.     albuterol (VENTOLIN HFA) 108 (90 Base) MCG/ACT inhaler  INHALE 1 TO 2 PUFFS INTO THE LUNGS EVERY 6 HOURS AS NEEDED FOR WHEEZING OR SHORTNESS OF BREATH (Patient not taking: Reported on 09/27/2022) 6.7 g 0   No current facility-administered medications for this visit.      Marland Kitchen  PHYSICAL EXAMINATION: ECOG PERFORMANCE STATUS: 0 - Asymptomatic  Vitals:   09/27/22 0957  BP: 108/67  Pulse: 83  Resp: 18  Temp: 100 F (37.8 C)  SpO2: 95%   Filed Weights   09/27/22 0957  Weight: 240 lb (108.9 kg)    Physical Exam HENT:     Head: Normocephalic and atraumatic.     Mouth/Throat:     Pharynx: No oropharyngeal exudate.  Eyes:     Pupils: Pupils are equal, round, and reactive to light.  Cardiovascular:     Rate and Rhythm: Normal rate and regular rhythm.  Pulmonary:     Effort: No respiratory distress.     Breath sounds: No  wheezing.  Abdominal:     General: Bowel sounds are normal. There is no distension.     Palpations: Abdomen is soft. There is no mass.     Tenderness: There is no abdominal tenderness. There is no guarding or rebound.  Musculoskeletal:        General: No tenderness. Normal range of motion.     Cervical back: Normal range of motion and neck supple.  Skin:    General: Skin is warm.  Neurological:     Mental Status: She is alert and oriented to person, place, and time.  Psychiatric:        Mood and Affect: Affect normal.      LABORATORY DATA:  I have reviewed the data as listed Lab Results  Component Value Date   WBC 7.0 09/08/2019   HGB 12.9 09/08/2019   HCT 40.3 09/08/2019   MCV 92.4 09/08/2019   PLT 257 09/08/2019   No results for input(s): "NA", "K", "CL", "CO2", "GLUCOSE", "BUN", "CREATININE", "CALCIUM", "GFRNONAA", "GFRAA", "PROT", "ALBUMIN", "AST", "ALT", "ALKPHOS", "BILITOT", "BILIDIR", "IBILI" in the last 8760 hours.  RADIOGRAPHIC STUDIES: I have personally reviewed the radiological images as listed and agreed with the findings in the report. DG Elbow Complete Right  Result Date:  09/23/2022 CLINICAL DATA:  Fall off ladder EXAM: RIGHT ELBOW - COMPLETE 3+ VIEW COMPARISON:  None Available. FINDINGS: No definite fracture is seen. However, there are displaced elbow joint fat pads, reflecting an elbow joint effusion, suggesting an occult radial head fracture. Visualized soft tissues are within normal limits. IMPRESSION: Elbow joint effusion, suggesting an occult radial head fracture. Electronically Signed   By: Julian Hy M.D.   On: 09/23/2022 03:44   DG Wrist Complete Right  Result Date: 09/23/2022 CLINICAL DATA:  Fall off ladder EXAM: RIGHT WRIST - COMPLETE 3+ VIEW COMPARISON:  None Available. FINDINGS: Mild irregularity involving the distal pole of the scaphoid along its radial aspect. Correlate for point tenderness to exclude a nondisplaced fracture. Otherwise, no fracture is seen. The joint spaces are preserved. Mild dorsal soft tissue swelling. IMPRESSION: Possible nondisplaced fracture involving the distal scaphoid, equivocal. Correlate for point tenderness. Electronically Signed   By: Julian Hy M.D.   On: 09/23/2022 03:44   DG Shoulder Right  Result Date: 09/23/2022 CLINICAL DATA:  Fall off ladder EXAM: RIGHT SHOULDER - 2+ VIEW COMPARISON:  None Available. FINDINGS: No fracture or dislocation is seen. The joint spaces are preserved. The visualized soft tissues are unremarkable. Visualized right lung is clear. IMPRESSION: Negative. Electronically Signed   By: Julian Hy M.D.   On: 09/23/2022 03:41   DG Lumbar Spine Complete  Result Date: 09/08/2022 CLINICAL DATA:  Patient experiencing back pain after bending over and hearing a "pop". Patient complains of constant pain at this time. HX arthritis. EXAM: LUMBAR SPINE - COMPLETE 4+ VIEW COMPARISON:  None Available. FINDINGS: Motion limits study. Normal alignment. Vertebral body heights are maintained. Multilevel intervertebral disc space loss and osteophyte formation. No focal bone lesion. SI joints are open  and symmetric. Nonobstructive bowel gas pattern. IMPRESSION: 1. No acute osseous abnormality of the lumbar spine. Study is somewhat limited by motion. 2. Multilevel degenerative disc disease. Electronically Signed   By: Audie Pinto M.D.   On: 09/08/2022 09:56    ASSESSMENT & PLAN:   Carcinoma of upper-inner quadrant of right breast in female, estrogen receptor positive ([place]) #  Stage I ER PR positive HER-2 negative breast cancer- Mammo-Dr.Cintron/oct 2023- -reviewed; within normal limits except  for seroma. Arimidex[feb 1st, 2021].  On exemestane [nov 2023]  # S/p fall- at work elbow fracture -unrelated to exemestane.  Awaiting ortho evaluation.   # Hot flashes G-1-sec to examestane- monitor for now- stable.   # JAN 2021 BMD- T score+ 0.-4; Normal-continue calcium plus vitamin D. Will repeat BMD in 2024- at next visit- stable.   # DISPOSITION:  # Follow up in 6 months- MD; labs- cbc/cmp; vit D 25-OH levels-- Dr.B  All questions were answered. The patient/family knows to call the clinic with any problems, questions or concerns.    Cammie Sickle, MD 09/27/2022 11:06 AM

## 2022-09-27 NOTE — Assessment & Plan Note (Addendum)
#    Stage I ER PR positive HER-2 negative breast cancer- Mammo-Dr.Cintron/oct 2023- -reviewed; within normal limits except for seroma. Arimidex[feb 1st, 2021].  On exemestane [nov 2023]  # S/p fall- at work elbow fracture -unrelated to exemestane.  Awaiting ortho evaluation.   # Hot flashes G-1-sec to examestane- monitor for now- stable.   # JAN 2021 BMD- T score+ 0.-4; Normal-continue calcium plus vitamin D. Will repeat BMD in 2024- at next visit- stable.   # DISPOSITION:  # Follow up in 6 months- MD; labs- cbc/cmp; vit D 25-OH levels-- Dr.B

## 2022-11-03 ENCOUNTER — Other Ambulatory Visit: Payer: Self-pay | Admitting: Internal Medicine

## 2022-11-17 ENCOUNTER — Other Ambulatory Visit: Payer: Self-pay | Admitting: Internal Medicine

## 2023-03-28 ENCOUNTER — Other Ambulatory Visit: Payer: Self-pay

## 2023-03-28 ENCOUNTER — Encounter: Payer: Self-pay | Admitting: Internal Medicine

## 2023-03-28 ENCOUNTER — Inpatient Hospital Stay: Payer: 59 | Attending: Internal Medicine | Admitting: Internal Medicine

## 2023-03-28 ENCOUNTER — Inpatient Hospital Stay: Payer: 59

## 2023-03-28 VITALS — BP 147/65 | HR 89 | Temp 99.1°F | Ht 64.0 in | Wt 247.4 lb

## 2023-03-28 DIAGNOSIS — Z170 Estrogen receptor positive status [ER+]: Secondary | ICD-10-CM

## 2023-03-28 DIAGNOSIS — C50211 Malignant neoplasm of upper-inner quadrant of right female breast: Secondary | ICD-10-CM | POA: Diagnosis not present

## 2023-03-28 DIAGNOSIS — Z923 Personal history of irradiation: Secondary | ICD-10-CM | POA: Insufficient documentation

## 2023-03-28 DIAGNOSIS — Z79811 Long term (current) use of aromatase inhibitors: Secondary | ICD-10-CM | POA: Diagnosis not present

## 2023-03-28 DIAGNOSIS — M25529 Pain in unspecified elbow: Secondary | ICD-10-CM | POA: Insufficient documentation

## 2023-03-28 DIAGNOSIS — Z803 Family history of malignant neoplasm of breast: Secondary | ICD-10-CM | POA: Diagnosis not present

## 2023-03-28 DIAGNOSIS — R232 Flushing: Secondary | ICD-10-CM | POA: Diagnosis not present

## 2023-03-28 LAB — CMP (CANCER CENTER ONLY)
ALT: 27 U/L (ref 0–44)
AST: 27 U/L (ref 15–41)
Albumin: 4.5 g/dL (ref 3.5–5.0)
Alkaline Phosphatase: 115 U/L (ref 38–126)
Anion gap: 12 (ref 5–15)
BUN: 15 mg/dL (ref 8–23)
CO2: 26 mmol/L (ref 22–32)
Calcium: 10.8 mg/dL — ABNORMAL HIGH (ref 8.9–10.3)
Chloride: 100 mmol/L (ref 98–111)
Creatinine: 0.8 mg/dL (ref 0.44–1.00)
GFR, Estimated: >60 mL/min (ref >60)
Glucose, Bld: 96 mg/dL (ref 70–99)
Potassium: 3.4 mmol/L — ABNORMAL LOW (ref 3.5–5.1)
Sodium: 138 mmol/L (ref 135–145)
Total Bilirubin: 0.7 mg/dL (ref 0.3–1.2)
Total Protein: 8.2 g/dL — ABNORMAL HIGH (ref 6.5–8.1)

## 2023-03-28 LAB — CBC WITH DIFFERENTIAL/PLATELET
Abs Immature Granulocytes: 0.01 10*3/uL (ref 0.00–0.07)
Basophils Absolute: 0 10*3/uL (ref 0.0–0.1)
Basophils Relative: 1 %
Eosinophils Absolute: 0.1 10*3/uL (ref 0.0–0.5)
Eosinophils Relative: 2 %
HCT: 41.1 % (ref 36.0–46.0)
Hemoglobin: 13.9 g/dL (ref 12.0–15.0)
Immature Granulocytes: 0 %
Lymphocytes Relative: 27 %
Lymphs Abs: 2.1 10*3/uL (ref 0.7–4.0)
MCH: 30 pg (ref 26.0–34.0)
MCHC: 33.8 g/dL (ref 30.0–36.0)
MCV: 88.8 fL (ref 80.0–100.0)
Monocytes Absolute: 0.5 10*3/uL (ref 0.1–1.0)
Monocytes Relative: 6 %
Neutro Abs: 5.1 10*3/uL (ref 1.7–7.7)
Neutrophils Relative %: 64 %
Platelets: 271 10*3/uL (ref 150–400)
RBC: 4.63 MIL/uL (ref 3.87–5.11)
RDW: 13.2 % (ref 11.5–15.5)
WBC: 7.9 10*3/uL (ref 4.0–10.5)
nRBC: 0 % (ref 0.0–0.2)

## 2023-03-28 LAB — VITAMIN D 25 HYDROXY (VIT D DEFICIENCY, FRACTURES): Vit D, 25-Hydroxy: 92.46 ng/mL (ref 30–100)

## 2023-03-28 NOTE — Assessment & Plan Note (Addendum)
#   2020- Stage I ER PR positive HER-2 negative breast cancer- Mammo-Dr.Cintron/oct 2023- -reviewed; within normal limits except for seroma. Arimidex[feb 1st, 2021].  On exemestane [nov 2023; until spring 2026]. Stable.   #  S/p fall- at work elbow fracture [s/p PT; no surgery]-unrelated to exemestane s/p ortho evaluation- stable.   # Hot flashes G-1-sec to examestane- monitor for now- stable.   # JAN 2021 BMD- T score+ 0.-4; Normal-HOLD calcium plus vitamin D- see below. Will repeat BMD in 2024- ordered today-    # Mild Hypercalcemia- Ca 10.8- monitor for now. STOP ca+vit D [on HCTZ]. Check PTH in 6 months.   # DISPOSITION:  # Follow up in 6 months- MD; labs- cbc/cmp; vit D 25-OH levels; BMD prior--- Dr.B

## 2023-03-28 NOTE — Progress Notes (Signed)
[facility] CONSULT NOTE  Patient Care Team: [place], [facility] as PCP - General Earna Coder, MD as Consulting Physician (Internal Medicine) Carolan Shiver, MD as Consulting Physician (General Surgery) Carmina Miller, MD as Referring Physician (Radiation Oncology)  CHIEF COMPLAINTS/PURPOSE OF CONSULTATION: Breast cancer  #  Oncology History Overview Note  # OCT 2020-RIGHT BREAST, UPPER INNER QUADRANT;  INVASIVE MAMMARY CARCINOMA, NO SPECIAL TYPE. S/p Lumpectomy & SLNBx [Dr.Cintron]pT1b pN0; G-1; ER/PR >90%; her 2neu-NEG;   # feb 1st 2021- Arimidex; STOPPED OCT 2023; sec to [facility]- NOV Started Examestane  # survivorship: pending; LMP- 2011/ No TAH or BSO  DIAGNOSIS: Right breast cancer  STAGE:    1     ;  GOALS: Cure     Carcinoma of upper-inner quadrant of right breast in female, estrogen receptor positive ([facility])  06/07/2019 Initial Diagnosis   Carcinoma of upper-inner quadrant of right breast in female, estrogen receptor positive ([facility])    HISTORY OF PRESENTING ILLNESS: Alone.  Independently.  Lisa Jennings 62 y.o.  female with above history of of right breast cancer stage I ER/PRpos; her2 NEG on examestane is here for follow-up.  No Breast discomfort. Hot flashes from her AI. Energy is good. Appetite is good. She continues to be compliant with her examestane. Complains of mild hot flashes.  Continues to complain of elbow pain s/p fracture. Currently on PT.   Otherwise denies worsening pain in the joints.    Review of Systems  Constitutional:  Negative for chills, diaphoresis, fever, malaise/fatigue and weight loss.  HENT:  Negative for nosebleeds and sore throat.   Eyes:  Negative for double vision.  Respiratory:  Negative for cough, hemoptysis, sputum production, shortness of breath and wheezing.   Cardiovascular:  Negative for chest pain, palpitations, orthopnea and leg swelling.  Gastrointestinal:  Negative for abdominal  pain, blood in stool, constipation, diarrhea, heartburn, melena, nausea and vomiting.  Genitourinary:  Negative for dysuria, frequency and urgency.  Musculoskeletal:  Positive for back pain and joint pain.  Skin: Negative.  Negative for itching and rash.  Neurological:  Negative for dizziness, tingling, focal weakness, weakness and headaches.  Endo/Heme/Allergies:  Does not bruise/bleed easily.  Psychiatric/Behavioral:  Negative for depression. The patient is not nervous/anxious and does not have insomnia.      MEDICAL HISTORY:  Past Medical History:  Diagnosis Date   Breast cancer ([facility])    Carcinoma of upper-inner quadrant of right breast in female, estrogen receptor positive ([facility]) 06/07/2019   Dyspnea    Family history of adverse reaction to anesthesia    nausea and vomiting   GERD (gastroesophageal reflux disease)    Headache    History of kidney stones    Hypertension    Personal history of radiation therapy 2020   Right lumpectomy   Pneumonia     SURGICAL HISTORY: Past Surgical History:  Procedure Laterality Date   APPENDECTOMY     BREAST BIOPSY Right 2016   neg/ bx -clip   BREAST BIOPSY Right 05/28/2019   x clip, stereo bx, invasive mammary carcinoma    BREAST LUMPECTOMY Right 06/11/2019   INVASIVE MAMMARY CARCINOMA   LITHOTRIPSY     PARTIAL MASTECTOMY WITH NEEDLE LOCALIZATION AND AXILLARY SENTINEL LYMPH NODE BX Right 06/11/2019   Procedure: PARTIAL MASTECTOMY WITH NEEDLE LOCALIZATION AND AXILLARY SENTINEL LYMPH NODE BX;  Surgeon: Carolan Shiver, MD;  Location: [facility] ORS;  Service: General;  Laterality: Right;   REPLACEMENT TOTAL KNEE Right 03/21/2021   TUBAL  LIGATION     WRIST FRACTURE SURGERY      SOCIAL HISTORY: Social History   Socioeconomic History   Marital status: Divorced    Spouse name: Not on file   Number of children: Not on file   Years of education: Not on file   Highest education level: Not on file  Occupational History   Not on file   Tobacco Use   Smoking status: Never   Smokeless tobacco: Never  Vaping Use   Vaping status: Never Used  Substance and Sexual Activity   Alcohol use: Never   Drug use: Never   Sexual activity: Not Currently  Other Topics Concern   Not on file  Social History Narrative   Lives in [place]/ with brother/ son& family. 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    Social Determinants of Health   Financial Resource Strain: Low Risk  (11/24/2022)   Received from [facility]   Overall Financial Resource Strain (CARDIA)    Difficulty of Paying Living Expenses: Not hard at all  Food Insecurity: No Food Insecurity (11/24/2022)   Received from [facility]   Hunger Vital Sign    Worried About Running Out of Food in the Last Year: Never true    Ran Out of Food in the Last Year: Never true  Transportation Needs: No Transportation Needs (11/24/2022)   Received from [facility]    In the past 12 months, has lack of transportation kept you from medical appointments or from getting medications?: No    Lack of Transportation (Non-Medical): No  Physical Activity: Not on file  Stress: Not on file  Social Connections: Not on file  Intimate Partner Violence: Not on file    FAMILY HISTORY: Family History  Problem Relation Age of Onset   Breast cancer Maternal Aunt 43   Diabetes Mother     ALLERGIES:  has No Known Allergies.  MEDICATIONS:  Current Outpatient Medications  Medication Sig Dispense Refill   acetaminophen (TYLENOL) 500 MG tablet Take 1,000 mg by mouth every 6 (six) hours as needed for moderate pain.     aspirin EC 81 MG tablet Take 81 mg by mouth daily.     Boswellia-Glucosamine-Vit D (OSTEO BI-FLEX ONE PER DAY PO) Take 1 tablet by mouth daily at 12 noon.     Calcium Carb-Cholecalciferol (CALCIUM 600+D) 600-800 MG-UNIT TABS Take 1 tablet by mouth daily.     exemestane (AROMASIN) 25 MG tablet  TAKE 1 TABLET BY MOUTH DAILY  AFTER BREAKFAST 90 tablet 3   meloxicam (MOBIC) 15 MG tablet Take 15 mg by mouth daily.     Multiple Vitamins-Minerals (CENTRUM SILVER ADULT 50+ PO) Take 1 tablet by mouth daily.     pantoprazole (PROTONIX) 40 MG tablet Take 40 mg by mouth daily.     valsartan-hydrochlorothiazide (DIOVAN-HCT) 160-12.5 MG tablet Take 1 tablet by mouth daily.     albuterol (VENTOLIN HFA) 108 (90 Base) MCG/ACT inhaler INHALE 1 TO 2 PUFFS INTO THE LUNGS EVERY 6 HOURS AS NEEDED FOR WHEEZING OR SHORTNESS OF BREATH (Patient not taking: Reported on 09/27/2022) 6.7 g 0   pyridoxine (B-6) 100 MG tablet Take 100 mg by mouth daily.     traMADol (ULTRAM) 50 MG tablet Take 1 tablet (50 mg total) by mouth every 6 (six) hours as needed. (Patient not taking: Reported on 03/28/2023) 15 tablet 0   No current facility-administered medications for this visit.      Marland Kitchen  PHYSICAL EXAMINATION: ECOG PERFORMANCE STATUS: 0 - Asymptomatic  Vitals:   03/28/23 0942 03/28/23 1000  BP: (!) 148/129 (!) 147/65  Pulse: 89   Temp: 99.1 F (37.3 C)   SpO2: 99%    Filed Weights   03/28/23 0942  Weight: 247 lb 6.4 oz (112.2 kg)    Physical Exam HENT:     Head: Normocephalic and atraumatic.     Mouth/Throat:     Pharynx: No oropharyngeal exudate.  Eyes:     Pupils: Pupils are equal, round, and reactive to light.  Cardiovascular:     Rate and Rhythm: Normal rate and regular rhythm.  Pulmonary:     Effort: No respiratory distress.     Breath sounds: No wheezing.  Abdominal:     General: Bowel sounds are normal. There is no distension.     Palpations: Abdomen is soft. There is no mass.     Tenderness: There is no abdominal tenderness. There is no guarding or rebound.  Musculoskeletal:        General: No tenderness. Normal range of motion.     Cervical back: Normal range of motion and neck supple.  Skin:    General: Skin is warm.  Neurological:     Mental Status: She is alert and oriented to person,  place, and time.  Psychiatric:        Mood and Affect: Affect normal.      LABORATORY DATA:  I have reviewed the data as listed Lab Results  Component Value Date   WBC 7.9 03/28/2023   HGB 13.9 03/28/2023   HCT 41.1 03/28/2023   MCV 88.8 03/28/2023   PLT 271 03/28/2023   Recent Labs    03/28/23 0943  NA 138  K 3.4*  CL 100  CO2 26  GLUCOSE 96  BUN 15  CREATININE 0.80  CALCIUM 10.8*  GFRNONAA >60  PROT 8.2*  ALBUMIN 4.5  AST 27  ALT 27  ALKPHOS 115  BILITOT 0.7    RADIOGRAPHIC STUDIES: I have personally reviewed the radiological images as listed and agreed with the findings in the report. No results found.  ASSESSMENT & PLAN:   Carcinoma of upper-inner quadrant of right breast in female, estrogen receptor positive ([facility]) # 2020- Stage I ER PR positive HER-2 negative breast cancer- Mammo-Dr.Cintron/oct 2023- -reviewed; within normal limits except for seroma. Arimidex[feb 1st, 2021].  On exemestane [nov 2023; until spring 2026]. Stable.   #  S/p fall- at work elbow fracture [s/p PT; no surgery]-unrelated to exemestane s/p ortho evaluation- stable.   # Hot flashes G-1-sec to examestane- monitor for now- stable.   # JAN 2021 BMD- T score+ 0.-4; Normal-HOLD calcium plus vitamin D- see below. Will repeat BMD in 2024- ordered today-    # Mild Hypercalcemia- Ca 10.8- monitor for now. STOP ca+vit D [on HCTZ]. Check PTH in 6 months.   # DISPOSITION:  # Follow up in 6 months- MD; labs- cbc/cmp; vit D 25-OH levels; BMD prior--- Dr.B  All questions were answered. The patient/family knows to call the clinic with any problems, questions or concerns.    Earna Coder, MD 03/28/2023 1:13 PM

## 2023-03-28 NOTE — Progress Notes (Signed)
No concerns today 

## 2023-04-13 ENCOUNTER — Ambulatory Visit (INDEPENDENT_AMBULATORY_CARE_PROVIDER_SITE_OTHER): Payer: 59

## 2023-04-13 ENCOUNTER — Encounter: Payer: Self-pay | Admitting: Emergency Medicine

## 2023-04-13 ENCOUNTER — Ambulatory Visit
Admission: EM | Admit: 2023-04-13 | Discharge: 2023-04-13 | Disposition: A | Discharge status: Home / Self Care | Payer: 59 | Attending: Physician Assistant | Admitting: Physician Assistant

## 2023-04-13 DIAGNOSIS — R509 Fever, unspecified: Secondary | ICD-10-CM | POA: Insufficient documentation

## 2023-04-13 DIAGNOSIS — R112 Nausea with vomiting, unspecified: Secondary | ICD-10-CM | POA: Diagnosis present

## 2023-04-13 DIAGNOSIS — Z1152 Encounter for screening for COVID-19: Secondary | ICD-10-CM | POA: Insufficient documentation

## 2023-04-13 DIAGNOSIS — R142 Eructation: Secondary | ICD-10-CM | POA: Diagnosis not present

## 2023-04-13 DIAGNOSIS — N390 Urinary tract infection, site not specified: Secondary | ICD-10-CM | POA: Diagnosis present

## 2023-04-13 LAB — URINALYSIS, W/ REFLEX TO CULTURE (INFECTION SUSPECTED)
Glucose, UA: NEGATIVE mg/dL
Ketones, ur: NEGATIVE mg/dL
Nitrite: POSITIVE — AB
Protein, ur: 100 mg/dL — AB
Specific Gravity, Urine: 1.015 (ref 1.005–1.030)
WBC, UA: >50 WBC/hpf (ref 0–5)
pH: 5.5 (ref 5.0–8.0)

## 2023-04-13 LAB — CBC WITH DIFFERENTIAL/PLATELET
Abs Immature Granulocytes: 0.04 10*3/uL (ref 0.00–0.07)
Basophils Absolute: 0 10*3/uL (ref 0.0–0.1)
Basophils Relative: 0 %
Eosinophils Absolute: 0 10*3/uL (ref 0.0–0.5)
Eosinophils Relative: 0 %
HCT: 40.9 % (ref 36.0–46.0)
Hemoglobin: 14.1 g/dL (ref 12.0–15.0)
Immature Granulocytes: 0 %
Lymphocytes Relative: 9 %
Lymphs Abs: 1 10*3/uL (ref 0.7–4.0)
MCH: 30.9 pg (ref 26.0–34.0)
MCHC: 34.5 g/dL (ref 30.0–36.0)
MCV: 89.7 fL (ref 80.0–100.0)
Monocytes Absolute: 0.7 10*3/uL (ref 0.1–1.0)
Monocytes Relative: 7 %
Neutro Abs: 8.6 10*3/uL — ABNORMAL HIGH (ref 1.7–7.7)
Neutrophils Relative %: 84 %
Platelets: 225 10*3/uL (ref 150–400)
RBC: 4.56 MIL/uL (ref 3.87–5.11)
RDW: 13.6 % (ref 11.5–15.5)
WBC: 10.4 10*3/uL (ref 4.0–10.5)
nRBC: 0 % (ref 0.0–0.2)

## 2023-04-13 LAB — COMPREHENSIVE METABOLIC PANEL
ALT: 23 U/L (ref 0–44)
AST: 26 U/L (ref 15–41)
Albumin: 4 g/dL (ref 3.5–5.0)
Alkaline Phosphatase: 104 U/L (ref 38–126)
Anion gap: 11 (ref 5–15)
BUN: 15 mg/dL (ref 8–23)
CO2: 23 mmol/L (ref 22–32)
Calcium: 9.1 mg/dL (ref 8.9–10.3)
Chloride: 102 mmol/L (ref 98–111)
Creatinine, Ser: 1.03 mg/dL — ABNORMAL HIGH (ref 0.44–1.00)
GFR, Estimated: >60 mL/min (ref >60)
Glucose, Bld: 140 mg/dL — ABNORMAL HIGH (ref 70–99)
Potassium: 3.7 mmol/L (ref 3.5–5.1)
Sodium: 136 mmol/L (ref 135–145)
Total Bilirubin: 1 mg/dL (ref 0.3–1.2)
Total Protein: 8.7 g/dL — ABNORMAL HIGH (ref 6.5–8.1)

## 2023-04-13 LAB — SARS CORONAVIRUS 2 BY RT PCR: SARS Coronavirus 2 by RT PCR: NEGATIVE

## 2023-04-13 LAB — RAPID INFLUENZA A&B ANTIGENS
Influenza A (ARMC): NEGATIVE
Influenza B (ARMC): NEGATIVE

## 2023-04-13 MED ORDER — ONDANSETRON 4 MG PO TBDP: 4.0000 mg | ORAL_TABLET | Freq: Three times a day (TID) | ORAL | 0 refills | Status: DC | PRN

## 2023-04-13 MED ORDER — CIPROFLOXACIN HCL 500 MG PO TABS: 500.0000 mg | ORAL_TABLET | Freq: Two times a day (BID) | ORAL | 0 refills | Status: AC

## 2023-04-13 MED ORDER — LIDOCAINE VISCOUS HCL 2 % MT SOLN
15.0000 mL | Freq: Once | OROMUCOSAL | Status: AC
  Administered 2023-04-13: 15 mL via ORAL

## 2023-04-13 MED ORDER — ALUM & MAG HYDROXIDE-SIMETH 200-200-20 MG/5ML PO SUSP
30.0000 mL | Freq: Once | ORAL | Status: AC
  Administered 2023-04-13: 30 mL via ORAL

## 2023-04-13 MED ORDER — CEFTRIAXONE SODIUM 1 G IJ SOLR
1.0000 g | Freq: Once | INTRAMUSCULAR | Status: AC
  Administered 2023-04-13: 1 g via INTRAMUSCULAR

## 2023-04-13 MED ORDER — ONDANSETRON 8 MG PO TBDP
8.0000 mg | ORAL_TABLET | Freq: Once | ORAL | Status: AC
  Administered 2023-04-13: 8 mg via ORAL

## 2023-04-13 MED ORDER — SIMETHICONE 250 MG PO CAPS: 25.0000 mg | ORAL_CAPSULE | Freq: Four times a day (QID) | ORAL | 1 refills | Status: DC

## 2023-04-13 NOTE — ED Triage Notes (Signed)
Patient c/o headache, vomiting and fever that started yesterday.  Patient denies any cold symptoms.

## 2023-04-13 NOTE — Discharge Instructions (Signed)

## 2023-04-13 NOTE — ED Provider Notes (Signed)
[facility]    CSN: [id number] Arrival date & time: 04/13/23  7829      History   Chief Complaint Chief Complaint  Patient presents with   Emesis   Fever    HPI Lisa Jennings is a 62 y.o. female presenting for headache, nausea/vomiting, fever and fatigue that began yesterday. Also reports urinary frequency. Chest pain at onset but not now. Has lower back pain bilaterally.   Denies cough, congestion, sore throat, shortness of breath, abdominal pain, constipation, diarrhea, dysuria.  No sick contacts or known exposure to COVID.  Patient's medical history significant for hypertension, kidney stones, breast cancer.  Surgical history significant for appendectomy, tubal ligation, lithotripsy, breast biopsy/lumpectomy/mastectomy, total knee replacement of the right knee.  HPI  Past Medical History:  Diagnosis Date   Breast cancer (HCC)    Carcinoma of upper-inner quadrant of right breast in female, estrogen receptor positive (HCC) 06/07/2019   Dyspnea    Family history of adverse reaction to anesthesia    nausea and vomiting   GERD (gastroesophageal reflux disease)    Headache    History of kidney stones    Hypertension    Personal history of radiation therapy 2020   Right lumpectomy   Pneumonia     Patient Active Problem List   Diagnosis Date Noted   Aftercare following joint replacement 04/09/2021   History of total right knee replacement 04/09/2021   De Quervain's tenosynovitis, right 10/17/2020   Morton's neuroma of left foot 10/04/2020   Achilles tendinitis of right lower extremity 04/25/2020   BMI 38.0-38.9,adult 03/28/2020   Carcinoma of upper-inner quadrant of right breast in female, estrogen receptor positive (HCC) 06/07/2019   Essential hypertension 05/26/2018   History of kidney stones 05/25/2015   Trigger middle finger of left hand 08/12/2014   Joint stiffness of hand 05/27/2013   Impingement syndrome of right shoulder 07/10/2012    Lateral epicondylitis 05/08/2012   Shoulder pain 05/08/2012    Past Surgical History:  Procedure Laterality Date   APPENDECTOMY     BREAST BIOPSY Right 2016   neg/ bx -clip   BREAST BIOPSY Right 05/28/2019   x clip, stereo bx, invasive mammary carcinoma    BREAST LUMPECTOMY Right 06/11/2019   INVASIVE MAMMARY CARCINOMA   LITHOTRIPSY     PARTIAL MASTECTOMY WITH NEEDLE LOCALIZATION AND AXILLARY SENTINEL LYMPH NODE BX Right 06/11/2019   Procedure: PARTIAL MASTECTOMY WITH NEEDLE LOCALIZATION AND AXILLARY SENTINEL LYMPH NODE BX;  Surgeon: Carolan Shiver, MD;  Location: [facility] ORS;  Service: General;  Laterality: Right;   REPLACEMENT TOTAL KNEE Right 03/21/2021   TUBAL LIGATION     WRIST FRACTURE SURGERY      OB History   No obstetric history on file.      Home Medications    Prior to Admission medications   Medication Sig Start Date End Date Taking? Authorizing Provider  ciprofloxacin (CIPRO) 500 MG tablet Take 1 tablet (500 mg total) by mouth every 12 (twelve) hours for 7 days. 04/13/23 04/20/23 Yes Eusebio Friendly B, PA-C  ondansetron (ZOFRAN-ODT) 4 MG disintegrating tablet Take 1 tablet (4 mg total) by mouth every 8 (eight) hours as needed. 04/13/23  Yes Shirlee Latch, PA-C  Simethicone 250 MG CAPS Take 25 mg by mouth 4 (four) times daily for 7 days. 04/13/23 04/20/23 Yes Shirlee Latch, PA-C  acetaminophen (TYLENOL) 500 MG tablet Take 1,000 mg by mouth every 6 (six) hours as needed for moderate pain.    [provider]  albuterol (VENTOLIN HFA) 108 (90 Base) MCG/ACT inhaler INHALE 1 TO 2 PUFFS INTO THE LUNGS EVERY 6 HOURS AS NEEDED FOR WHEEZING OR SHORTNESS OF BREATH Patient not taking: Reported on 09/27/2022 04/10/22   Ivonne Andrew, NP  aspirin EC 81 MG tablet Take 81 mg by mouth daily.    [provider]  Boswellia-Glucosamine-Vit D (OSTEO BI-FLEX ONE PER DAY PO) Take 1 tablet by mouth daily at 12 noon.    [provider]  Calcium  Carb-Cholecalciferol (CALCIUM 600+D) 600-800 MG-UNIT TABS Take 1 tablet by mouth daily.    [provider]  exemestane (AROMASIN) 25 MG tablet TAKE 1 TABLET BY MOUTH DAILY  AFTER BREAKFAST 11/04/22   Earna Coder, MD  meloxicam (MOBIC) 15 MG tablet Take 15 mg by mouth daily. 04/07/19   [provider]  Multiple Vitamins-Minerals (CENTRUM SILVER ADULT 50+ PO) Take 1 tablet by mouth daily.    [provider]  pantoprazole (PROTONIX) 40 MG tablet Take 40 mg by mouth daily. 03/25/23   [provider]  pyridoxine (B-6) 100 MG tablet Take 100 mg by mouth daily.    [provider]  traMADol (ULTRAM) 50 MG tablet Take 1 tablet (50 mg total) by mouth every 6 (six) hours as needed. Patient not taking: Reported on 03/28/2023 09/23/22 09/23/23  Phineas Semen, MD  valsartan-hydrochlorothiazide (DIOVAN-HCT) 160-12.5 MG tablet Take 1 tablet by mouth daily. 05/06/19   [provider]    Family History Family History  Problem Relation Age of Onset   Breast cancer Maternal Aunt 36   Diabetes Mother     Social History Social History   Tobacco Use   Smoking status: Never   Smokeless tobacco: Never  Vaping Use   Vaping status: Never Used  Substance Use Topics   Alcohol use: Never   Drug use: Never     Allergies   Patient has no known allergies.   Review of Systems Review of Systems  Constitutional:  Positive for fatigue and fever. Negative for chills and diaphoresis.  HENT:  Negative for congestion, ear pain, rhinorrhea and sore throat.   Respiratory:  Negative for cough and shortness of breath.   Cardiovascular:  Negative for chest pain.  Gastrointestinal:  Positive for nausea and vomiting. Negative for abdominal pain, constipation and diarrhea.  Genitourinary:  Positive for frequency. Negative for difficulty urinating, dysuria and pelvic pain.  Musculoskeletal:  Positive for back pain. Negative for arthralgias and myalgias.  Skin:   Negative for rash.  Neurological:  Positive for headaches. Negative for dizziness and weakness.  Hematological:  Negative for adenopathy.     Physical Exam Triage Vital Signs ED Triage Vitals  Encounter Vitals Group     BP      Systolic BP Percentile      Diastolic BP Percentile      Pulse      Resp      Temp      Temp src      SpO2      Weight      Height      Head Circumference      Peak Flow      Pain Score      Pain Loc      Pain Education      Exclude from Growth Chart    No data found.  Updated Vital Signs BP 135/85 (BP Location: Left Arm)   Pulse 91   Temp (!) 100.7 F (38.2  C) (Oral)   Resp 15   Ht 5\' 4"  (1.626 m)   Wt 247 lb 5.7 oz (112.2 kg)   SpO2 95%   BMI 42.46 kg/m    Physical Exam Vitals and nursing note reviewed.  Constitutional:      General: She is not in acute distress.    Appearance: Normal appearance. She is not ill-appearing or toxic-appearing.     Comments: Patient is constantly belching.  HENT:     Head: Normocephalic and atraumatic.     Nose: Nose normal.     Mouth/Throat:     Mouth: Mucous membranes are moist.     Pharynx: Oropharynx is clear.  Eyes:     General: No scleral icterus.       Right eye: No discharge.        Left eye: No discharge.     Conjunctiva/sclera: Conjunctivae normal.  Cardiovascular:     Rate and Rhythm: Normal rate and regular rhythm.     Heart sounds: Normal heart sounds.  Pulmonary:     Effort: Pulmonary effort is normal. No respiratory distress.     Breath sounds: Normal breath sounds.  Abdominal:     Palpations: Abdomen is soft.     Tenderness: There is no abdominal tenderness. There is right CVA tenderness and left CVA tenderness. There is no guarding or rebound.  Musculoskeletal:     Cervical back: Neck supple.  Skin:    General: Skin is dry.  Neurological:     General: No focal deficit present.     Mental Status: She is alert. Mental status is at baseline.     Motor: No weakness.      Gait: Gait normal.  Psychiatric:        Mood and Affect: Mood normal.        Behavior: Behavior normal.        Thought Content: Thought content normal.      UC Treatments / Results  Labs (all labs ordered are listed, but only abnormal results are displayed) Labs Reviewed  URINALYSIS, W/ REFLEX TO CULTURE (INFECTION SUSPECTED) - Abnormal; Notable for the following components:      Result Value   Color, Urine AMBER (*)    APPearance HAZY (*)    Hgb urine dipstick MODERATE (*)    Bilirubin Urine SMALL (*)    Protein, ur 100 (*)    Nitrite POSITIVE (*)    Leukocytes,Ua LARGE (*)    Bacteria, UA MANY (*)    All other components within normal limits  CBC WITH DIFFERENTIAL/PLATELET - Abnormal; Notable for the following components:   Neutro Abs 8.6 (*)    All other components within normal limits  COMPREHENSIVE METABOLIC PANEL - Abnormal; Notable for the following components:   Glucose, Bld 140 (*)    Creatinine, Ser 1.03 (*)    Total Protein 8.7 (*)    All other components within normal limits  SARS CORONAVIRUS 2 BY RT PCR  RAPID INFLUENZA A&B ANTIGENS  URINE CULTURE    EKG   Radiology DG Chest 2 View  Result Date: 04/13/2023 CLINICAL DATA:  Fever and chills.  Nausea and back pain EXAM: CHEST - 2 VIEW COMPARISON:  03/15/2022 FINDINGS: Normal heart size and mediastinal contours. No acute infiltrate or edema. No effusion or pneumothorax. No acute osseous findings. IMPRESSION: No active cardiopulmonary disease. Electronically Signed   By: Tiburcio Pea M.D.   On: 04/13/2023 11:47   DG Abdomen 1 View  Result Date:  04/13/2023 CLINICAL DATA:  Fever and chills with nausea EXAM: ABDOMEN - 1 VIEW COMPARISON:  None Available. FINDINGS: Moderately gas distended stomach. Formed stool in the colon. No evidence of bowel obstruction. No concerning mass effect or calcification. Clear lung bases. IMPRESSION: 1. Nonobstructive bowel gas pattern. 2. Gas distended stomach. Electronically Signed    By: Tiburcio Pea M.D.   On: 04/13/2023 11:47    Procedures Procedures (including critical care time)  Medications Ordered in UC Medications  cefTRIAXone (ROCEPHIN) injection 1 g (has no administration in time range)  ondansetron (ZOFRAN-ODT) disintegrating tablet 8 mg (8 mg Oral Given 04/13/23 1026)  alum & mag hydroxide-simeth (MAALOX/MYLANTA) 200-200-20 MG/5ML suspension 30 mL (30 mLs Oral Given 04/13/23 1135)    And  lidocaine (XYLOCAINE) 2 % viscous mouth solution 15 mL (15 mLs Oral Given 04/13/23 1135)    Initial Impression / Assessment and Plan / UC Course  I have reviewed the triage vital signs and the nursing notes.  Pertinent labs & imaging results that were available during my care of the patient were reviewed by me and considered in my medical decision making (see chart for details).   63 year old female presents for headaches, fever, nausea and vomiting that began yesterday.  Also reports lower back pain, urinary frequency. Chest pain and SOB for a few minutes when symptoms began, but not now. Denies cough, congestion, sore throat, chest pain, shortness of breath, abdominal pain, diarrhea, constipation, dysuria, urgency, hematuria.   Medical history significant for GERD, HTN, breast cancer, nephrolithiasis.  Temp currently 100.7 degrees.  Other vitals normal and stable.  On exam, patient does not appear significantly ill.  She is belching constantly and holding emesis bag.  Normal HEENT exam.  Chest clear.  Abdomen soft with no tenderness.  Bilateral subjective CVA tenderness.  Patient given ODT Zofran 8 mg and GI cocktail.  COVID testing obtained.  Negative.  Reviewed COVID test result patient.  Advised patient we should perform further workup.  She is agreeable.  Ordered CBC, CMP, urinalysis, KUB, chest x-ray and rapid flu.  Negative flu. CBC and CMP without any significant findings. Chest x-ray normal. KUB shows distended gas in the stomach.  Patient has known  history of excess gas, belching and burping.  States she is set to have an endoscopy soon.  Urinalysis shows hazy amber-colored urine with moderate hemoglobin, small bili, protein, large leukocytes, positive nitrates, many bacteria.  Urine sent for culture.  This is consistent with urinary tract infection.  Suspect a sending UTI given fever.  Patient given 1 g IM Rocephin in clinic and I sent Cipro to the pharmacy.  Also sent Zofran for nausea and simethicone for gas.  Reviewed ED precautions.  Acute illness with systemic symptoms.   Final Clinical Impressions(s) / UC Diagnoses   Final diagnoses:  Urinary tract infection without hematuria, site unspecified  Fever, unspecified fever cause  Nausea and vomiting, unspecified vomiting type  Belching     Discharge Instructions      UTI: Based on either symptoms or urinalysis, you may have a urinary tract infection. We will send the urine for culture and call with results in a few days. Begin antibiotics at this time. Your symptoms should be much improved over the next 2-3 days. Increase rest and fluid intake. If for some reason symptoms are worsening or not improving after a couple of days or the urine culture determines the antibiotics you are taking will not treat the infection, the antibiotics may be changed.  Return or go to ER for fever, back pain, worsening urinary pain, discharge, increased blood in urine. May take Tylenol or Motrin OTC for pain relief or consider AZO if no contraindications      ED Prescriptions     Medication Sig Dispense Auth. Provider   ciprofloxacin (CIPRO) 500 MG tablet Take 1 tablet (500 mg total) by mouth every 12 (twelve) hours for 7 days. 14 tablet Gareth Morgan   Simethicone 250 MG CAPS Take 25 mg by mouth 4 (four) times daily for 7 days. 28 capsule Eusebio Friendly B, PA-C   ondansetron (ZOFRAN-ODT) 4 MG disintegrating tablet Take 1 tablet (4 mg total) by mouth every 8 (eight) hours as needed. 15 tablet  Gareth Morgan      PDMP not reviewed this encounter.   Shirlee Latch, PA-C 04/13/23 1256

## 2023-04-16 LAB — URINE CULTURE: Culture: >=100000 — AB

## 2023-05-01 ENCOUNTER — Other Ambulatory Visit: Payer: Self-pay | Admitting: General Surgery

## 2023-05-01 DIAGNOSIS — Z1231 Encounter for screening mammogram for malignant neoplasm of breast: Secondary | ICD-10-CM

## 2023-05-22 ENCOUNTER — Ambulatory Visit: Payer: 59 | Admitting: Radiation Oncology

## 2023-06-02 ENCOUNTER — Ambulatory Visit
Admission: RE | Admit: 2023-06-02 | Discharge: 2023-06-02 | Disposition: A | Discharge status: Home / Self Care | Payer: 59 | Source: Ambulatory Visit | Attending: Radiation Oncology | Admitting: Radiation Oncology

## 2023-06-02 ENCOUNTER — Encounter: Payer: Self-pay | Admitting: Radiation Oncology

## 2023-06-02 VITALS — BP 133/79 | HR 74 | Resp 16 | Ht 64.0 in | Wt 241.0 lb

## 2023-06-02 DIAGNOSIS — C50211 Malignant neoplasm of upper-inner quadrant of right female breast: Secondary | ICD-10-CM | POA: Insufficient documentation

## 2023-06-02 DIAGNOSIS — Z170 Estrogen receptor positive status [ER+]: Secondary | ICD-10-CM | POA: Diagnosis not present

## 2023-06-02 DIAGNOSIS — Z79810 Long term (current) use of selective estrogen receptor modulators (SERMs): Secondary | ICD-10-CM | POA: Diagnosis not present

## 2023-06-02 DIAGNOSIS — Z923 Personal history of irradiation: Secondary | ICD-10-CM | POA: Diagnosis not present

## 2023-06-02 NOTE — Progress Notes (Signed)
Radiation Oncology Follow up Note  Name: Lisa Jennings   Date:   06/02/2023 MRN:  [id number] DOB: 29-Nov-1960    This 62 y.o. female presents to the clinic today for 3-1/2-year follow-up status post whole breast radiation to her right breast for stage I ER/PR positive invasive mammary carcinoma.  REFERRING PROVIDER: [place]  HPI: Patient is a 62 year old female now out over 3-1/2 years having completed whole breast radiation to her right breast for stage I ER/PR positive invasive mammary carcinoma.  Seen today in routine follow-up she is doing well.  She specifically denies breast tenderness cough or bone pain..  She had mammograms back in October which were BI-RADS 2 benign which I have reviewed she has another mammogram tomorrow which I will review when it is available.  Patient is currently on Aromasin tolerating that well.  COMPLICATIONS OF TREATMENT: none  FOLLOW UP COMPLIANCE: keeps appointments   PHYSICAL EXAM:  BP 133/79   Pulse 74   Resp 16   Ht 5\' 4"  (1.626 m)   Wt 241 lb (109.3 kg)   BMI 41.37 kg/m lungs are clear to A&P cardiac examination essentially unremarkable with regular rate and rhythm. No dominant mass or nodularity is noted in either breast in 2 positions examined. Incision is well-healed. No axillary or supraclavicular adenopathy is appreciated. Cosmetic result is excellent. Well-developed well-nourished patient in NAD. HEENT reveals PERLA, EOMI, discs not visualized.  Oral cavity is clear. No oral mucosal lesions are identified. Neck is clear without evidence of cervical or supraclavicular adenopathy. Lungs are clear to A&P. Cardiac examination is essentially unremarkable with regular rate and rhythm without murmur rub or thrill. Abdomen is benign with no organomegaly or masses noted. Motor sensory and DTR levels are equal and symmetric in the upper and lower extremities. Cranial nerves II through XII are grossly intact. Proprioception is  intact. No peripheral adenopathy or edema is identified. No motor or sensory levels are noted. Crude visual fields are within normal range.  RADIOLOGY RESULTS: Mammograms both from last October and tomorrow will be reviewed and compatible with above-stated findings  PLAN: Present time patient is now out over 3-1/2 years with no evidence of disease.  I am pleased with her overall progress.  I will turn follow-up care over to medical oncology.  Patient knows to call with any concerns at any time.  She continues on Aromasin without side effect.  Mammogram is tomorrow.  I would like to take this opportunity to thank you for allowing me to participate in the care of your patient.Carmina Miller, MD

## 2023-06-03 ENCOUNTER — Ambulatory Visit
Admission: RE | Admit: 2023-06-03 | Discharge: 2023-06-03 | Disposition: A | Discharge status: Home / Self Care | Payer: 59 | Source: Ambulatory Visit | Attending: General Surgery | Admitting: General Surgery

## 2023-06-03 DIAGNOSIS — Z1231 Encounter for screening mammogram for malignant neoplasm of breast: Secondary | ICD-10-CM | POA: Insufficient documentation

## 2023-08-01 ENCOUNTER — Encounter: Payer: Self-pay | Admitting: *Deleted

## 2023-09-09 ENCOUNTER — Ambulatory Visit: Admitting: Internal Medicine

## 2023-09-09 ENCOUNTER — Other Ambulatory Visit

## 2023-09-23 ENCOUNTER — Ambulatory Visit
Admission: RE | Admit: 2023-09-23 | Discharge: 2023-09-23 | Disposition: A | Discharge status: Home / Self Care | Payer: 59 | Source: Ambulatory Visit | Attending: Internal Medicine | Admitting: Internal Medicine

## 2023-09-23 DIAGNOSIS — Z170 Estrogen receptor positive status [ER+]: Secondary | ICD-10-CM | POA: Diagnosis present

## 2023-09-23 DIAGNOSIS — C50211 Malignant neoplasm of upper-inner quadrant of right female breast: Secondary | ICD-10-CM | POA: Insufficient documentation

## 2023-10-03 ENCOUNTER — Other Ambulatory Visit: Payer: Self-pay

## 2023-10-03 ENCOUNTER — Ambulatory Visit: Payer: 59 | Admitting: General Practice

## 2023-10-03 ENCOUNTER — Encounter: Payer: Self-pay | Admitting: *Deleted

## 2023-10-03 ENCOUNTER — Ambulatory Visit: Payer: 59 | Admitting: Internal Medicine

## 2023-10-03 ENCOUNTER — Ambulatory Visit
Admission: RE | Admit: 2023-10-03 | Discharge: 2023-10-03 | Disposition: A | Discharge status: Home / Self Care | Payer: 59 | Attending: Gastroenterology | Admitting: Gastroenterology

## 2023-10-03 ENCOUNTER — Encounter
Admission: RE | Disposition: A | Discharge status: Home / Self Care | Payer: Self-pay | Source: Home / Self Care | Attending: Gastroenterology

## 2023-10-03 ENCOUNTER — Other Ambulatory Visit: Payer: 59

## 2023-10-03 DIAGNOSIS — I10 Essential (primary) hypertension: Secondary | ICD-10-CM | POA: Diagnosis not present

## 2023-10-03 DIAGNOSIS — K640 First degree hemorrhoids: Secondary | ICD-10-CM | POA: Diagnosis not present

## 2023-10-03 DIAGNOSIS — Z853 Personal history of malignant neoplasm of breast: Secondary | ICD-10-CM | POA: Diagnosis not present

## 2023-10-03 DIAGNOSIS — K5730 Diverticulosis of large intestine without perforation or abscess without bleeding: Secondary | ICD-10-CM | POA: Diagnosis not present

## 2023-10-03 DIAGNOSIS — K2950 Unspecified chronic gastritis without bleeding: Secondary | ICD-10-CM | POA: Diagnosis not present

## 2023-10-03 DIAGNOSIS — Z1211 Encounter for screening for malignant neoplasm of colon: Secondary | ICD-10-CM | POA: Diagnosis present

## 2023-10-03 DIAGNOSIS — Z83719 Family history of colon polyps, unspecified: Secondary | ICD-10-CM | POA: Insufficient documentation

## 2023-10-03 DIAGNOSIS — K219 Gastro-esophageal reflux disease without esophagitis: Secondary | ICD-10-CM | POA: Insufficient documentation

## 2023-10-03 DIAGNOSIS — Z923 Personal history of irradiation: Secondary | ICD-10-CM | POA: Diagnosis not present

## 2023-10-03 HISTORY — DX: Trigger finger, left middle finger: M65.332

## 2023-10-03 HISTORY — DX: Unspecified fracture of navicular (scaphoid) bone of unspecified wrist, initial encounter for closed fracture: S62.009A

## 2023-10-03 HISTORY — DX: Displaced fracture of neck of right radius, initial encounter for closed fracture: S52.131A

## 2023-10-03 HISTORY — PX: ESOPHAGOGASTRODUODENOSCOPY (EGD) WITH PROPOFOL: SHX5813

## 2023-10-03 HISTORY — DX: Unspecified osteoarthritis, unspecified site: M19.90

## 2023-10-03 HISTORY — DX: Radial styloid tenosynovitis (de quervain): M65.4

## 2023-10-03 HISTORY — DX: Lateral epicondylitis, unspecified elbow: M77.10

## 2023-10-03 HISTORY — PX: COLONOSCOPY WITH PROPOFOL: SHX5780

## 2023-10-03 HISTORY — DX: Lesion of plantar nerve, left lower limb: G57.62

## 2023-10-03 HISTORY — DX: Achilles tendinitis, right leg: M76.61

## 2023-10-03 HISTORY — DX: Impingement syndrome of right shoulder: M75.41

## 2023-10-03 HISTORY — PX: BIOPSY: SHX5522

## 2023-10-03 SURGERY — COLONOSCOPY WITH PROPOFOL
Anesthesia: General

## 2023-10-03 MED ORDER — SIMETHICONE 40 MG/0.6ML PO SUSP
ORAL | Status: DC | PRN
  Administered 2023-10-03: 240 mL

## 2023-10-03 MED ORDER — SODIUM CHLORIDE 0.9 % IV SOLN: INTRAVENOUS | Status: DC

## 2023-10-03 MED ORDER — PROPOFOL 10 MG/ML IV BOLUS
INTRAVENOUS | Status: DC | PRN
Start: 2023-10-03 — End: 2023-10-03
  Administered 2023-10-03 (×2): 20 mg via INTRAVENOUS
  Administered 2023-10-03: 40 mg via INTRAVENOUS
  Administered 2023-10-03: 30 mg via INTRAVENOUS
  Administered 2023-10-03: 40 mg via INTRAVENOUS
  Administered 2023-10-03 (×6): 20 mg via INTRAVENOUS

## 2023-10-03 MED ORDER — PROPOFOL 10 MG/ML IV BOLUS
INTRAVENOUS | Status: AC
  Filled 2023-10-03: qty 20

## 2023-10-03 MED ORDER — LIDOCAINE HCL (PF) 1 % IJ SOLN
INTRAMUSCULAR | Status: DC | PRN
Start: 2023-10-03 — End: 2023-10-03
  Administered 2023-10-03: 40 mg

## 2023-10-03 NOTE — Op Note (Signed)
 [facility]astroenterology Patient Name: Lisa Jennings Procedure Date: 10/03/2023 12:48 PM MRN: [id number] Account #: [id number] Date of Birth: 29-Dec-1960 Admit Type: Outpatient Age: 63 Room: [facility] ENDO ROOM 3 Gender: Female Note Status: Finalized Instrument Name: [id number] [id number] Procedure:             Colonoscopy Indications:           Colon cancer screening in patient at increased risk:                         Family history of 1st-degree relative with colon polyps Providers:             Ole Schick MD, MD Referring MD:          [facility] [place] (Referring MD) Medicines:             Monitored Anesthesia Care Complications:         No immediate complications. Procedure:             Pre-Anesthesia Assessment:                        - Prior to the procedure, a History and Physical was                         performed, and patient medications and allergies were                         reviewed. The patient is competent. The risks and                         benefits of the procedure and the sedation options and                         risks were discussed with the patient. All questions                         were answered and informed consent was obtained.                         Patient identification and proposed procedure were                         verified by the physician, the nurse, the                         anesthesiologist, the anesthetist and the technician                         in the endoscopy suite. Mental Status Examination:                         alert and oriented. Airway Examination: normal                         oropharyngeal airway and neck mobility. Respiratory                         Examination: clear to auscultation. CV Examination:  normal. Prophylactic Antibiotics: The patient does not                         require prophylactic antibiotics. Prior                         Anticoagulants:  The patient has taken no anticoagulant                         or antiplatelet agents. ASA Grade Assessment: III - A                         patient with severe systemic disease. After reviewing                         the risks and benefits, the patient was deemed in                         satisfactory condition to undergo the procedure. The                         anesthesia plan was to use monitored anesthesia care                         (MAC). Immediately prior to administration of                         medications, the patient was re-assessed for adequacy                         to receive sedatives. The heart rate, respiratory                         rate, oxygen saturations, blood pressure, adequacy of                         pulmonary ventilation, and response to care were                         monitored throughout the procedure. The physical                         status of the patient was re-assessed after the                         procedure.                        After obtaining informed consent, the colonoscope was                         passed under direct vision. Throughout the procedure,                         the patient's blood pressure, pulse, and oxygen                         saturations were monitored continuously. The  Colonoscope was introduced through the anus and                         advanced to the the cecum, identified by appendiceal                         orifice and ileocecal valve. The colonoscopy was                         performed without difficulty. The patient tolerated                         the procedure well. The quality of the bowel                         preparation was adequate to identify polyps. The                         ileocecal valve, appendiceal orifice, and rectum were                         photographed. Findings:      The perianal and digital rectal examinations were normal.      Scattered  large-mouthed and small-mouthed diverticula were found in the       sigmoid colon, descending colon, transverse colon and ascending colon.      Internal hemorrhoids were found during retroflexion. The hemorrhoids       were Grade I (internal hemorrhoids that do not prolapse).      The exam was otherwise without abnormality on direct and retroflexion       views. Impression:            - Diverticulosis in the sigmoid colon, in the                         descending colon, in the transverse colon and in the                         ascending colon.                        - Internal hemorrhoids.                        - The examination was otherwise normal on direct and                         retroflexion views.                        - No specimens collected. Recommendation:        - Discharge patient to home.                        - Resume previous diet.                        - Continue present medications.                        - Repeat colonoscopy in 10 years for  screening                         purposes.                        - Return to referring physician as previously                         scheduled. Procedure Code(s):     --- Professional ---                        H9894, Colorectal cancer screening; colonoscopy on                         individual at high risk Diagnosis Code(s):     --- Professional ---                        Z83.71, Family history of colonic polyps                        K64.0, First degree hemorrhoids                        K57.30, Diverticulosis of large intestine without                         perforation or abscess without bleeding CPT copyright 2022 [employer]. All rights reserved. The codes documented in this report are preliminary and upon coder review may  be revised to meet current compliance requirements. Ole Schick MD, MD 10/03/2023 1:31:08 PM Number of Addenda: 0 Note Initiated On: 10/03/2023 12:48 PM Scope  Withdrawal Time: 0 hours 7 minutes 37 seconds  Total Procedure Duration: 0 hours 11 minutes 16 seconds  Estimated Blood Loss:  Estimated blood loss: none.      [facility]

## 2023-10-03 NOTE — H&P (Signed)
 Outpatient short stay form Pre-procedure 10/03/2023  Lisa ONEIDA Schick, MD  Primary Physician: [facility]  Reason for visit:  GERD/Family history of polyps  History of present illness:    63 y/o lady with history of arthritis, hypertension, and breast cancer here for EGD for GERD and colonoscopy for family history of polyps. Last colonoscopy in 2014 was normal. No blood thinners. No family history of GI malignancies. History of appendectomy.    Current Facility-Administered Medications:    0.9 %  sodium chloride  infusion, , Intravenous, Continuous, Grant Henkes, Lisa ONEIDA, MD, Last Rate: 20 mL/hr at 10/03/23 1248, Continued from Pre-op at 10/03/23 1248  Medications Prior to Admission  Medication Sig Dispense Refill Last Dose/Taking   aspirin EC 81 MG tablet Take 81 mg by mouth daily.   10/02/2023   exemestane  (AROMASIN ) 25 MG tablet TAKE 1 TABLET BY MOUTH DAILY  AFTER BREAKFAST 90 tablet 3 10/02/2023   meloxicam (MOBIC) 15 MG tablet Take 15 mg by mouth daily.   10/02/2023   pantoprazole (PROTONIX) 40 MG tablet Take 40 mg by mouth daily.   10/02/2023   pyridoxine (B-6) 100 MG tablet Take 100 mg by mouth daily.   Past Week   valsartan-hydrochlorothiazide (DIOVAN-HCT) 160-12.5 MG tablet Take 1 tablet by mouth daily.   10/03/2023   acetaminophen  (TYLENOL ) 500 MG tablet Take 1,000 mg by mouth every 6 (six) hours as needed for moderate pain.      albuterol  (VENTOLIN  HFA) 108 (90 Base) MCG/ACT inhaler INHALE 1 TO 2 PUFFS INTO THE LUNGS EVERY 6 HOURS AS NEEDED FOR WHEEZING OR SHORTNESS OF BREATH (Patient not taking: Reported on 09/27/2022) 6.7 g 0    Boswellia-Glucosamine-Vit D (OSTEO BI-FLEX ONE PER DAY PO) Take 1 tablet by mouth daily at 12 noon.      Calcium Carb-Cholecalciferol (CALCIUM 600+D) 600-800 MG-UNIT TABS Take 1 tablet by mouth daily.      Multiple Vitamins-Minerals (CENTRUM SILVER  ADULT 50+ PO) Take 1 tablet by mouth daily.      ondansetron  (ZOFRAN -ODT) 4 MG disintegrating tablet  Take 1 tablet (4 mg total) by mouth every 8 (eight) hours as needed. 15 tablet 0    Simethicone  250 MG CAPS Take 25 mg by mouth 4 (four) times daily for 7 days. 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    Physical Exam  Gen: Alert, oriented. Appears stated age.  HEENT: PERRLA. Lungs: No respiratory distress CV: RRR Abd: soft, benign, no masses Ext: No edema    Planned  procedures: Proceed with EGD/colonoscopy. The patient understands the nature of the planned procedure, indications, risks, alternatives and potential complications including but not limited to bleeding, infection, perforation, damage to internal organs and possible oversedation/side effects from anesthesia. The patient agrees and gives consent to proceed.  Please refer to procedure notes for findings, recommendations and patient disposition/instructions.     Lisa ONEIDA Schick, MD [facility]

## 2023-10-03 NOTE — Op Note (Signed)
 [facility] Patient Name: Lisa Jennings Procedure Date: 10/03/2023 12:52 PM MRN: [id number] Account #: [id number] Date of Birth: [birth year] Admit Type: Outpatient Age: 63 Room: [facility] ENDO ROOM 3 Gender: Female Note Status: Finalized Instrument Name: Upper Endoscope [id number] Procedure:             Upper GI endoscopy Indications:           Gastro-esophageal reflux disease Providers:             Ole Schick MD, MD Referring MD:          [facility] (Referring MD) Medicines:             Monitored Anesthesia Care Complications:         No immediate complications. Estimated blood loss:                         Minimal. Procedure:             Pre-Anesthesia Assessment:                        - Prior to the procedure, a History and Physical was                         performed, and patient medications and allergies were                         reviewed. The patient is competent. The risks and                         benefits of the procedure and the sedation options and                         risks were discussed with the patient. All questions                         were answered and informed consent was obtained.                         Patient identification and proposed procedure were                         verified by the physician, the nurse, the                         anesthesiologist, the anesthetist and the technician                         in the endoscopy suite. Mental Status Examination:                         alert and oriented. Airway Examination: normal                         oropharyngeal airway and neck mobility. Respiratory                         Examination: clear to auscultation. CV Examination:  normal. Prophylactic Antibiotics: The patient does not                         require prophylactic antibiotics. Prior                         Anticoagulants: The patient has taken no  anticoagulant                         or antiplatelet agents. ASA Grade Assessment: III - A                         patient with severe systemic disease. After reviewing                         the risks and benefits, the patient was deemed in                         satisfactory condition to undergo the procedure. The                         anesthesia plan was to use monitored anesthesia care                         (MAC). Immediately prior to administration of                         medications, the patient was re-assessed for adequacy                         to receive sedatives. The heart rate, respiratory                         rate, oxygen saturations, blood pressure, adequacy of                         pulmonary ventilation, and response to care were                         monitored throughout the procedure. The physical                         status of the patient was re-assessed after the                         procedure.                        After obtaining informed consent, the endoscope was                         passed under direct vision. Throughout the procedure,                         the patient's blood pressure, pulse, and oxygen                         saturations were monitored continuously. The  Endosonoscope was introduced through the mouth, and                         advanced to the second part of duodenum. The upper GI                         endoscopy was accomplished without difficulty. The                         patient tolerated the procedure well. Findings:      The examined esophagus was normal.      Patchy mild inflammation characterized by erythema was found in the       gastric antrum. Biopsies were taken with a cold forceps for Helicobacter       pylori testing. Estimated blood loss was minimal.      The examined duodenum was normal. Impression:            - Normal esophagus.                        - Gastritis.  Biopsied.                        - Normal examined duodenum. Recommendation:        - Discharge patient to home.                        - Resume previous diet.                        - Continue present medications.                        - Await pathology results.                        - Return to referring physician as previously                         scheduled. Procedure Code(s):     --- Professional ---                        [phone], Esophagogastroduodenoscopy, flexible,                         transoral; with biopsy, single or multiple Diagnosis Code(s):     --- Professional ---                        K29.70, Gastritis, unspecified, without bleeding                        K21.9, Gastro-esophageal reflux disease without                         esophagitis CPT copyright 2022 American Medical Association. All rights reserved. The codes documented in this report are preliminary and upon coder review may  be revised to meet current compliance requirements. Ole Schick MD, MD 10/03/2023 1:28:39 PM Number of Addenda: 0 Note Initiated On: 10/03/2023 12:52 PM Estimated Blood Loss:  Estimated blood loss was minimal.  [facility]

## 2023-10-03 NOTE — Transfer of Care (Signed)
 Immediate Anesthesia Transfer of Care Note  Patient: Lisa Jennings  Procedure(s) Performed: COLONOSCOPY WITH PROPOFOL  ESOPHAGOGASTRODUODENOSCOPY (EGD) WITH PROPOFOL   Patient Location: PACU and Endoscopy Unit  Anesthesia Type:MAC  Level of Consciousness: drowsy  Airway & Oxygen Therapy: Patient Spontanous Breathing and Patient connected to nasal cannula oxygen  Post-op Assessment: Report given to RN and Post -op Vital signs reviewed and stable  Post vital signs: Reviewed and stable  Last Vitals:  Vitals Value Taken Time  BP 113/75   Temp    Pulse 76   Resp 16   SpO2 98     Last Pain:  Vitals:   10/03/23 1221  TempSrc: Temporal  PainSc: 0-No pain         Complications: No notable events documented.

## 2023-10-03 NOTE — Interval H&P Note (Signed)
 History and Physical Interval Note:  10/03/2023 12:53 PM  Lisa Jennings  has presented today for surgery, with the diagnosis of GERD, FAMILY HX OF POLYP OF COLON.  The various methods of treatment have been discussed with the patient and family. After consideration of risks, benefits and other options for treatment, the patient has consented to  Procedure(s): COLONOSCOPY WITH PROPOFOL  (N/A) ESOPHAGOGASTRODUODENOSCOPY (EGD) WITH PROPOFOL  (N/A) as a surgical intervention.  The patient's history has been reviewed, patient examined, no change in status, stable for surgery.  I have reviewed the patient's chart and labs.  Questions were answered to the patient's satisfaction.     Lisa Jennings  Ok to proceed with EGD/Colonoscopy

## 2023-10-03 NOTE — Anesthesia Preprocedure Evaluation (Signed)
 Anesthesia Evaluation  Patient identified by MRN, date of birth, ID band Patient awake    Reviewed: Allergy & Precautions, NPO status , Patient's Chart, lab work & pertinent test results  History of Anesthesia Complications (+) Family history of anesthesia reactionNegative for: history of anesthetic complications  Airway Mallampati: III  TM Distance: >3 FB Neck ROM: full    Dental  (+) Chipped, Dental Advidsory Given   Pulmonary neg pulmonary ROS, neg sleep apnea, neg COPD, Not current smoker   Pulmonary exam normal        Cardiovascular hypertension, Pt. on medications (-) Past MI and (-) CHF Normal cardiovascular exam(-) dysrhythmias (-) Valvular Problems/Murmurs     Neuro/Psych neg Seizures negative neurological ROS  negative psych ROS   GI/Hepatic negative GI ROS, Neg liver ROS,GERD  Medicated and Controlled,,  Endo/Other  negative endocrine ROSneg diabetes    Renal/GU negative Renal ROS  negative genitourinary   Musculoskeletal   Abdominal   Peds  Hematology negative hematology ROS (+)   Anesthesia Other Findings Past Medical History: No date: Achilles tendinitis of right lower extremity No date: Arthritis No date: Breast cancer (HCC) 06/07/2019: Carcinoma of upper-inner quadrant of right breast in  female, estrogen receptor positive (HCC) No date: Closed fracture of neck of right radius No date: Closed fracture of scaphoid bone of wrist No date: De Quervain's tenosynovitis, right No date: Dyspnea No date: Family history of adverse reaction to anesthesia     Comment:  nausea and vomiting No date: GERD (gastroesophageal reflux disease) No date: Headache No date: History of kidney stones No date: Hypertension No date: Impingement syndrome of right shoulder No date: Lateral epicondylitis No date: Morton's neuroma of left foot 2020: Personal history of radiation therapy     Comment:  Right lumpectomy No  date: Pneumonia No date: Trigger middle finger of left hand  Past Surgical History: No date: APPENDECTOMY 2016: BREAST BIOPSY; Right     Comment:  neg/ bx -clip 05/28/2019: BREAST BIOPSY; Right     Comment:  x clip, stereo bx, invasive mammary carcinoma  06/11/2019: BREAST LUMPECTOMY; Right     Comment:  INVASIVE MAMMARY CARCINOMA No date: JOINT REPLACEMENT No date: LITHOTRIPSY 06/11/2019: PARTIAL MASTECTOMY WITH NEEDLE LOCALIZATION AND AXILLARY  SENTINEL LYMPH NODE BX; Right     Comment:  Procedure: PARTIAL MASTECTOMY WITH NEEDLE LOCALIZATION               AND AXILLARY SENTINEL LYMPH NODE BX;  Surgeon:               Rodolph Romano, MD;  Location: [facility] ORS;  Service:              General;  Laterality: Right; 03/21/2021: REPLACEMENT TOTAL KNEE; Right No date: TENOTOMY HAND / FINGER PERCUTANEOUS No date: TUBAL LIGATION No date: WRIST CAPSULOTOMY No date: WRIST FRACTURE SURGERY  BMI    Body Mass Index: 41.02 kg/m      Reproductive/Obstetrics negative OB ROS                             Anesthesia Physical Anesthesia Plan  ASA: 3  Anesthesia Plan: General   Post-op Pain Management: Minimal or no pain anticipated   Induction: Intravenous  PONV Risk Score and Plan: 3 and Propofol  infusion, TIVA and Ondansetron   Airway Management Planned: Nasal Cannula  Additional Equipment: None  Intra-op Plan:   Post-operative Plan:   Informed Consent: I have reviewed the  patients History and Physical, chart, labs and discussed the procedure including the risks, benefits and alternatives for the proposed anesthesia with the patient or authorized representative who has indicated his/her understanding and acceptance.     Dental advisory given  Plan Discussed with: CRNA and Surgeon  Anesthesia Plan Comments: (Discussed risks of anesthesia with patient, including possibility of difficulty with spontaneous ventilation under anesthesia necessitating  airway intervention, PONV, and rare risks such as cardiac or respiratory or neurological events, and allergic reactions. Discussed the role of CRNA in patient's perioperative care. Patient understands.)       Anesthesia Quick Evaluation

## 2023-10-05 NOTE — Anesthesia Postprocedure Evaluation (Signed)
 Anesthesia Post Note  Patient: Lisa Jennings  Procedure(s) Performed: COLONOSCOPY WITH PROPOFOL  ESOPHAGOGASTRODUODENOSCOPY (EGD) WITH PROPOFOL  BIOPSY  Patient location during evaluation: Endoscopy Anesthesia Type: General Level of consciousness: awake and alert Pain management: pain level controlled Vital Signs Assessment: post-procedure vital signs reviewed and stable Respiratory status: spontaneous breathing, nonlabored ventilation, respiratory function stable and patient connected to nasal cannula oxygen Cardiovascular status: blood pressure returned to baseline and stable Postop Assessment: no apparent nausea or vomiting Anesthetic complications: no   No notable events documented.   Last Vitals:  Vitals:   10/03/23 1342 10/03/23 1352  BP: 115/89 100/84  Pulse: 64 (!) 59  Resp: 14 14  Temp:    SpO2: 93% 98%    Last Pain:  Vitals:   10/04/23 0902  TempSrc:   PainSc: 0-No pain                 Debby [place]ines

## 2023-10-06 ENCOUNTER — Encounter: Payer: Self-pay | Admitting: Gastroenterology

## 2023-10-06 ENCOUNTER — Inpatient Hospital Stay (HOSPITAL_BASED_OUTPATIENT_CLINIC_OR_DEPARTMENT_OTHER): Payer: 59 | Admitting: Internal Medicine

## 2023-10-06 ENCOUNTER — Inpatient Hospital Stay: Payer: 59 | Attending: Internal Medicine

## 2023-10-06 DIAGNOSIS — Z79811 Long term (current) use of aromatase inhibitors: Secondary | ICD-10-CM | POA: Insufficient documentation

## 2023-10-06 DIAGNOSIS — Z170 Estrogen receptor positive status [ER+]: Secondary | ICD-10-CM | POA: Diagnosis not present

## 2023-10-06 DIAGNOSIS — C50211 Malignant neoplasm of upper-inner quadrant of right female breast: Secondary | ICD-10-CM | POA: Insufficient documentation

## 2023-10-06 DIAGNOSIS — Z1721 Progesterone receptor positive status: Secondary | ICD-10-CM | POA: Insufficient documentation

## 2023-10-06 DIAGNOSIS — M791 Myalgia, unspecified site: Secondary | ICD-10-CM | POA: Diagnosis not present

## 2023-10-06 DIAGNOSIS — Z1732 Human epidermal growth factor receptor 2 negative status: Secondary | ICD-10-CM | POA: Diagnosis not present

## 2023-10-06 DIAGNOSIS — R232 Flushing: Secondary | ICD-10-CM | POA: Insufficient documentation

## 2023-10-06 DIAGNOSIS — Z803 Family history of malignant neoplasm of breast: Secondary | ICD-10-CM | POA: Diagnosis not present

## 2023-10-06 DIAGNOSIS — M79671 Pain in right foot: Secondary | ICD-10-CM | POA: Insufficient documentation

## 2023-10-06 LAB — CBC WITH DIFFERENTIAL (CANCER CENTER ONLY)
Abs Immature Granulocytes: 0.04 10*3/uL (ref 0.00–0.07)
Basophils Absolute: 0 10*3/uL (ref 0.0–0.1)
Basophils Relative: 1 %
Eosinophils Absolute: 0.2 10*3/uL (ref 0.0–0.5)
Eosinophils Relative: 2 %
HCT: 41.1 % (ref 36.0–46.0)
Hemoglobin: 13.6 g/dL (ref 12.0–15.0)
Immature Granulocytes: 1 %
Lymphocytes Relative: 26 %
Lymphs Abs: 2 10*3/uL (ref 0.7–4.0)
MCH: 30 pg (ref 26.0–34.0)
MCHC: 33.1 g/dL (ref 30.0–36.0)
MCV: 90.7 fL (ref 80.0–100.0)
Monocytes Absolute: 0.5 10*3/uL (ref 0.1–1.0)
Monocytes Relative: 6 %
Neutro Abs: 5.1 10*3/uL (ref 1.7–7.7)
Neutrophils Relative %: 64 %
Platelet Count: 260 10*3/uL (ref 150–400)
RBC: 4.53 MIL/uL (ref 3.87–5.11)
RDW: 13 % (ref 11.5–15.5)
WBC Count: 7.8 10*3/uL (ref 4.0–10.5)
nRBC: 0 % (ref 0.0–0.2)

## 2023-10-06 LAB — CMP (CANCER CENTER ONLY)
ALT: 28 U/L (ref 0–44)
AST: 30 U/L (ref 15–41)
Albumin: 4.4 g/dL (ref 3.5–5.0)
Alkaline Phosphatase: 106 U/L (ref 38–126)
Anion gap: 12 (ref 5–15)
BUN: 17 mg/dL (ref 8–23)
CO2: 24 mmol/L (ref 22–32)
Calcium: 9.6 mg/dL (ref 8.9–10.3)
Chloride: 102 mmol/L (ref 98–111)
Creatinine: 0.81 mg/dL (ref 0.44–1.00)
GFR, Estimated: >60 mL/min (ref >60)
Glucose, Bld: 94 mg/dL (ref 70–99)
Potassium: 3.7 mmol/L (ref 3.5–5.1)
Sodium: 138 mmol/L (ref 135–145)
Total Bilirubin: 0.9 mg/dL (ref 0.0–1.2)
Total Protein: 8 g/dL (ref 6.5–8.1)

## 2023-10-06 LAB — SURGICAL PATHOLOGY

## 2023-10-06 LAB — VITAMIN D 25 HYDROXY (VIT D DEFICIENCY, FRACTURES): Vit D, 25-Hydroxy: 80.57 ng/mL (ref 30–100)

## 2023-10-06 NOTE — Patient Instructions (Addendum)
#   recommend HOLDING exemestane  until next visit.

## 2023-10-06 NOTE — Progress Notes (Signed)
 C/o rt foot pain, tendonitis and bone spurs, 6/10.  Bone density 09/23/23. Had a colonoscopy and endoscopy last Friday.

## 2023-10-06 NOTE — Assessment & Plan Note (Addendum)
#   2020- Stage I ER PR positive HER-2 negative breast cancer- Mammo-Dr.Cintron/oct 2024- -reviewed; within normal limits except for seroma. Arimidex  [feb 1st, 2021].  On exemestane  [nov 2023; until spring 2026]. Stable.   # However. Worsening MSK pain- recommend HOLDING exemestane  until next visit.    # MSK [Right heel pain]- sec to exemestane - vs others- see above.   # Hot flashes G-1-sec to examestane- monitor for now- stable.   # JAN 2025- BMD- T-score of -0.6. Normal-HOLD calcium plus vitamin D - see below. Stable.    # Mild Hypercalcemia- Ca 10.8- monitor for now. NO ca+vit D [on HCTZ]. Pending-PTH-   # Screening: JAN 2025- colonoscopy/ EGD [[facility]]- NEG.   # DISPOSITION:  # Follow up in 6  weeks- MD: no labs- Dr.B

## 2023-10-06 NOTE — Progress Notes (Signed)
 [facility] CONSULT NOTE  Patient Care Team: [place], [facility] as PCP - General Gwyn Leos, MD as Consulting Physician (Internal Medicine) Eldred Grego, MD as Consulting Physician (General Surgery) Glenis Langdon, MD as Referring Physician (Radiation Oncology)  CHIEF COMPLAINTS/PURPOSE OF CONSULTATION: Breast cancer  #  Oncology History Overview Note  # OCT 2020-RIGHT BREAST, UPPER INNER QUADRANT;  INVASIVE MAMMARY CARCINOMA, NO SPECIAL TYPE. S/p Lumpectomy & SLNBx [Dr.Cintron]pT1b pN0; G-1; ER/PR >90%; her 2neu-NEG;   # feb 1st 2021- Arimidex ; STOPPED OCT 2023; sec to MSK- NOV Started Examestane  # survivorship: pending; LMP- 2011/ No TAH or BSO  DIAGNOSIS: Right breast cancer  STAGE:    1     ;  GOALS: Cure     Carcinoma of upper-inner quadrant of right breast in female, estrogen receptor positive (HCC)  06/07/2019 Initial Diagnosis   Carcinoma of upper-inner quadrant of right breast in female, estrogen receptor positive (HCC)    HISTORY OF PRESENTING ILLNESS: Alone.  Independently.  Lisa Jennings 63 y.o.  female with above history of of right breast cancer stage I ER/PRpos; her2 NEG on examestane is here for follow-up.  Patient complains of worsening right leg foot pain.  S/p evaluation-noted to have tendinitis.  Progressively getting worse.  No Breast discomfort. Hot flashes from her AI. Energy is good. Appetite is good. She continues to be compliant with her examestane. Complains of mild hot flashes.   Review of Systems  Constitutional:  Negative for chills, diaphoresis, fever, malaise/fatigue and weight loss.  HENT:  Negative for nosebleeds and sore throat.   Eyes:  Negative for double vision.  Respiratory:  Negative for cough, hemoptysis, sputum production, shortness of breath and wheezing.   Cardiovascular:  Negative for chest pain, palpitations, orthopnea and leg swelling.  Gastrointestinal:  Negative for  abdominal pain, blood in stool, constipation, diarrhea, heartburn, melena, nausea and vomiting.  Genitourinary:  Negative for dysuria, frequency and urgency.  Musculoskeletal:  Positive for back pain and joint pain.  Skin: Negative.  Negative for itching and rash.  Neurological:  Negative for dizziness, tingling, focal weakness, weakness and headaches.  Endo/Heme/Allergies:  Does not bruise/bleed easily.  Psychiatric/Behavioral:  Negative for depression. The patient is not nervous/anxious and does not have insomnia.      MEDICAL HISTORY:  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   Closed fracture of neck of right radius    Closed fracture of scaphoid bone of wrist    De Quervain's tenosynovitis, right    Dyspnea    Family history of adverse reaction to anesthesia    nausea and vomiting   GERD (gastroesophageal reflux disease)    Headache    History of kidney stones    Hypertension    Impingement syndrome of right shoulder    Lateral epicondylitis    Morton's neuroma of left foot    Personal history of radiation therapy 2020   Right lumpectomy   Pneumonia    Trigger middle finger of left hand     SURGICAL HISTORY: Past Surgical History:  Procedure Laterality Date   APPENDECTOMY     BIOPSY  10/03/2023   Procedure: BIOPSY;  Surgeon: Shane Darling, MD;  Location: [facility] END[facility]COPY;  Service: Endoscopy;;   BREAST BIOPSY Right 2016  neg/ bx -clip   BREAST BIOPSY Right 05/28/2019   x clip, stereo bx, invasive mammary carcinoma    BREAST LUMPECTOMY Right 06/11/2019   INVASIVE MAMMARY CARCINOMA   COLONOSCOPY WITH PROPOFOL  N/A 10/03/2023   Procedure: COLONOSCOPY WITH PROPOFOL ;  Surgeon: Shane Darling, MD;  Location: [facility];  Service: Endoscopy;  Laterality: N/A;    ESOPHAGOGASTRODUODENOSCOPY (EGD) WITH PROPOFOL  N/A 10/03/2023   Procedure: ESOPHAGOGASTRODUODENOSCOPY (EGD) WITH PROPOFOL ;  Surgeon: Shane Darling, MD;  Location: [facility];  Service: Endoscopy;  Laterality: N/A;   JOINT REPLACEMENT     LITHOTRIPSY     PARTIAL MASTECTOMY WITH NEEDLE LOCALIZATION AND AXILLARY SENTINEL LYMPH NODE BX Right 06/11/2019   Procedure: PARTIAL MASTECTOMY WITH NEEDLE LOCALIZATION AND AXILLARY SENTINEL LYMPH NODE BX;  Surgeon: Eldred Grego, MD;  Location: [facility] ORS;  Service: General;  Laterality: Right;   REPLACEMENT TOTAL KNEE Right 03/21/2021   TENOTOMY HAND / FINGER PERCUTANEOUS     TUBAL LIGATION     WRIST CAPSULOTOMY     WRIST FRACTURE SURGERY      SOCIAL HISTORY: Social History   Socioeconomic History   Marital status: Divorced    Spouse name: Not on file   Number of children: Not on file   Years of education: Not on file   Highest education level: Not on file  Occupational History   Not on file  Tobacco Use   Smoking status: Never   Smokeless tobacco: Never  Vaping Use   Vaping status: Never Used  Substance and Sexual Activity   Alcohol use: Never   Drug use: Never   Sexual activity: Not Currently  Other Topics Concern   Not on file  Social History Narrative   Lives in [place]/ with brother/ son& family. 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    Social Drivers of Corporate investment banker Strain: Low Risk  (11/24/2022)   Received from [facility]   Overall Financial Resource Strain (CARDIA)    Difficulty of Paying Living Expenses: Not hard at all  Food Insecurity: No Food Insecurity (11/24/2022)   Received from [facility]   Hunger Vital Sign    Worried About Running Out of Food in the Last Year: Never true    Ran Out of Food in the Last Year: Never true  Transportation Needs: No Transportation Needs (11/24/2022)   Received from [facility] -  Transportation    In the past 12 months, has lack of transportation kept you from medical appointments or from getting medications?: No    Lack of Transportation (Non-Medical): No  Physical Activity: Not on file  Stress: Not on file  Social Connections: Not on file  Intimate Partner Violence: Not on file    FAMILY HISTORY: Family History  Problem Relation Age of Onset   Breast cancer Maternal Aunt 75   Diabetes Mother     ALLERGIES:  has no known allergies.  MEDICATIONS:  Current Outpatient Medications  Medication Sig Dispense Refill   acetaminophen  (TYLENOL ) 500 MG tablet Take 1,000 mg by mouth every 6 (six) hours as needed for moderate pain.     aspirin EC 81 MG tablet Take 81 mg by mouth daily.     Boswellia-Glucosamine-Vit D (OSTEO BI-FLEX ONE PER DAY PO) Take 1 tablet by mouth daily at 12 noon.     exemestane  (AROMASIN ) 25 MG tablet TAKE 1 TABLET BY MOUTH DAILY  AFTER BREAKFAST 90 tablet 3  meloxicam (MOBIC) 15 MG tablet Take 15 mg by mouth daily.     Multiple Vitamins-Minerals (CENTRUM SILVER  ADULT 50+ PO) Take 1 tablet by mouth daily.     pantoprazole (PROTONIX) 40 MG tablet Take 40 mg by mouth daily.     pyridoxine (B-6) 100 MG tablet Take 100 mg by mouth daily.     valsartan-hydrochlorothiazide (DIOVAN-HCT) 320-12.5 MG tablet Take 1 tablet by mouth daily.     No current facility-administered medications for this visit.      Aaron Aas  PHYSICAL EXAMINATION: ECOG PERFORMANCE STATUS: 0 - Asymptomatic  Vitals:   10/06/23 1016  BP: 130/68  Pulse: 71  Temp: (!) 97.5 F (36.4 C)  SpO2: 100%   Filed Weights   10/06/23 1016  Weight: 243 lb (110.2 kg)    Physical Exam HENT:     Head: Normocephalic and atraumatic.     Mouth/Throat:     Pharynx: No oropharyngeal exudate.  Eyes:     Pupils: Pupils are equal, round, and reactive to light.  Cardiovascular:     Rate and Rhythm: Normal rate and regular rhythm.  Pulmonary:     Effort: No respiratory distress.      Breath sounds: No wheezing.  Abdominal:     General: Bowel sounds are normal. There is no distension.     Palpations: Abdomen is soft. There is no mass.     Tenderness: There is no abdominal tenderness. There is no guarding or rebound.  Musculoskeletal:        General: No tenderness. Normal range of motion.     Cervical back: Normal range of motion and neck supple.  Skin:    General: Skin is warm.  Neurological:     Mental Status: She is alert and oriented to person, place, and time.  Psychiatric:        Mood and Affect: Affect normal.      LABORATORY DATA:  I have reviewed the data as listed Lab Results  Component Value Date   WBC 7.8 10/06/2023   HGB 13.6 10/06/2023   HCT 41.1 10/06/2023   MCV 90.7 10/06/2023   PLT 260 10/06/2023   Recent Labs    03/28/23 0943 04/13/23 1124 10/06/23 1005  NA 138 136 138  K 3.4* 3.7 3.7  CL 100 102 102  CO2 26 23 24   GLUCOSE 96 140* 94  BUN 15 15 17   CREATININE 0.80 1.03* 0.81  CALCIUM 10.8* 9.1 9.6  GFRNONAA >60 >60 >60  PROT 8.2* 8.7* 8.0  ALBUMIN 4.5 4.0 4.4  AST 27 26 30   ALT 27 23 28   ALKPHOS 115 104 106  BILITOT 0.7 1.0 0.9    RADIOGRAPHIC STUDIES: I have personally reviewed the radiological images as listed and agreed with the findings in the report. DG Bone Density Result Date: 09/23/2023 EXAM: DUAL X-RAY ABSORPTIOMETRY (DXA) FOR BONE MINERAL DENSITY IMPRESSION: Your patient Hanora Scruton completed a BMD test on 09/23/2023 using the [employer] iDXA DXA System (software version: 14.10) manufactured by [employer]. The following summarizes the results of our evaluation. Technologist: MTB PATIENT BIOGRAPHICAL: Name: Saprina, Milan Patient ID: [id number] Birth Date: 15-Jan-1961 Height: 64.0 in. Gender: Female Exam Date: 09/23/2023 Weight: 241.0 lbs. Indications: Caucasian, History of Breast Cancer, History of Fracture (Adult), History of Radiation, Postmenopausal Fractures: Elbow Right, Wrist Left, Wrist Right  Treatments: calcium w/ vit D, Exemestane  DENSITOMETRY RESULTS: Site         Region     Measured Date Measured  Age WHO Classification Young Adult T-score BMD         %Change vs. Previous Significant Change (*) DualFemur Neck Right 09[age]23/2023 62.2 Normal -0.6 0.960 g/cm2 -4.8% - DualFemur Neck Right 09/22/2019 58.2 Normal -0.2 1.008 g/cm2 - - DualFemur Total Mean 09/23/2023 62.2 Normal 0.2 1.036 g/cm2 -2.7% Yes DualFemur Total Mean 09/22/2019 58.2 Normal 0.5 1.065 g/cm2 - - Left Forearm Radius 33% 09/23/2023 62.2 Normal 0.2 0.897 g/cm2 - - ASSESSMENT: The BMD measured at Femur Neck Right is 0.960 g/cm2 with a T-score of -0.6. This patient is considered normal according to World Health Organization [facility]) criteria. The scan quality is good. Lumbar spine was not utilized due to advanced degenerative changes. Compared with prior study, there has been significant decrease in the total hip. World Science writer [facility]) criteria for post-menopausal, Caucasian Women: Normal:                   T-score at or above -1 SD Osteopenia/low bone mass: T-score between -1 and -2.5 SD Osteoporosis:             T-score at or below -2.5 SD RECOMMENDATIONS: 1. All patients should optimize calcium and vitamin D  intake. 2. Consider FDA-approved medical therapies in postmenopausal women and men aged 48 years and older, based on the following: a. A hip or vertebral(clinical or morphometric) fracture b. T-score < -2.5 at the femoral neck or spine after appropriate evaluation to exclude secondary causes c. Low bone mass (T-score between -1.0 and -2.5 at the femoral neck or spine) and a 10-year probability of a hip fracture > 3% or a 10-year probability of a major osteoporosis-related fracture > 20% based on the [place] -adapted WHO algorithm 3. Clinician judgment and/or patient preferences may indicate treatment for people with 10-year fracture probabilities above or below these levels FOLLOW-UP: People with diagnosed cases of osteoporosis or at  high risk for fracture should have regular bone mineral density tests. For patients eligible for Medicare, routine testing is allowed once every 2 years. The testing frequency can be increased to one year for patients who have rapidly progressing disease, those who are receiving or discontinuing medical therapy to restore bone mass, or have additional risk factors. I have reviewed this report, and agree with the above findings. [facility] Electronically Signed   By: Tita Form M.D.   On: 09/23/2023 14:42    ASSESSMENT & PLAN:   Carcinoma of upper-inner quadrant of right breast in female, estrogen receptor positive (HCC) # 2020- Stage I ER PR positive HER-2 negative breast cancer- Mammo-Dr.Cintron/oct 2024- -reviewed; within normal limits except for seroma. Arimidex  [feb 1st, 2021].  On exemestane  [nov 2023; until spring 2026]. Stable.   # However. Worsening MSK pain- recommend HOLDING exemestane  until next visit.    # MSK [Right heel pain]- sec to exemestane - vs others- see above.   # Hot flashes G-1-sec to examestane- monitor for now- stable.   # JAN 2025- BMD- T-score of -0.6. Normal-HOLD calcium plus vitamin D - see below. Stable.    # Mild Hypercalcemia- Ca 10.8- monitor for now. NO ca+vit D [on HCTZ]. Pending-PTH-   # Screening: JAN 2025- colonoscopy/ EGD [[facility]]- NEG.   # DISPOSITION:  # Follow up in 6  weeks- MD: no labs- Dr.B  All questions were answered. The patient/family knows to call the clinic with any problems, questions or concerns.    Gwyn Leos, MD 10/06/2023 10:59 AM

## 2023-10-07 LAB — PARATHYROID HORMONE, INTACT (NO CA): PTH: 17 pg/mL (ref 15–65)

## 2023-10-23 ENCOUNTER — Other Ambulatory Visit: Payer: Self-pay | Admitting: Internal Medicine

## 2023-11-17 ENCOUNTER — Inpatient Hospital Stay: Payer: 59 | Attending: Internal Medicine | Admitting: Internal Medicine

## 2023-11-17 ENCOUNTER — Encounter: Payer: Self-pay | Admitting: Internal Medicine

## 2023-11-17 DIAGNOSIS — Z170 Estrogen receptor positive status [ER+]: Secondary | ICD-10-CM | POA: Diagnosis not present

## 2023-11-17 DIAGNOSIS — Z79811 Long term (current) use of aromatase inhibitors: Secondary | ICD-10-CM | POA: Diagnosis not present

## 2023-11-17 DIAGNOSIS — M79671 Pain in right foot: Secondary | ICD-10-CM | POA: Insufficient documentation

## 2023-11-17 DIAGNOSIS — C50211 Malignant neoplasm of upper-inner quadrant of right female breast: Secondary | ICD-10-CM | POA: Diagnosis present

## 2023-11-17 DIAGNOSIS — R232 Flushing: Secondary | ICD-10-CM | POA: Diagnosis not present

## 2023-11-17 DIAGNOSIS — Z1732 Human epidermal growth factor receptor 2 negative status: Secondary | ICD-10-CM | POA: Insufficient documentation

## 2023-11-17 DIAGNOSIS — Z1721 Progesterone receptor positive status: Secondary | ICD-10-CM | POA: Insufficient documentation

## 2023-11-17 MED ORDER — LETROZOLE 2.5 MG PO TABS: 2.5000 mg | ORAL_TABLET | Freq: Every day | ORAL | 3 refills | Status: DC

## 2023-11-17 NOTE — Progress Notes (Signed)
 Discuss aromasin, has been off x6 weeks.

## 2023-11-17 NOTE — Assessment & Plan Note (Addendum)
#   2020- Stage I ER PR positive HER-2 negative breast cancer- Mammo-Dr.Cintron/oct 2024- -reviewed; within normal limits except for seroma. Arimidex [feb 1st, 2021].  On exemestane [nov 2023; until spring 2026]- ON HOLD sec to MSK- see below.   # However. Worsening MSK pain- recommend DISCONTINUE exemestane - JAN 2025. Start Letrozole  # MSK [Right heel pain]- sec to exemestane-will switch to [employer]. Scipt sent-   # Hot flashes G-1-sec to examestane- monitor for now- stable.   # JAN 2025- BMD- T-score of -0.6. Normal-HOLD calcium plus vitamin D- see below. Stable.    # Mild Hypercalcemia- Ca 10.8- monitor for now. NO ca+vit D [on HCTZ]. Pending-PTH-   # Screening: JAN 2025- colonoscopy/ EGD [[facility]]- NEG.   # DISPOSITION:  # Follow up in 3 months- MD: no labs- Dr.B

## 2023-11-17 NOTE — Progress Notes (Signed)
 [facility] CONSULT NOTE  Patient Care Team: [place], [facility] as PCP - General Earna Coder, MD as Consulting Physician (Internal Medicine) Carolan Shiver, MD as Consulting Physician (General Surgery) Carmina Miller, MD as Referring Physician (Radiation Oncology)  CHIEF COMPLAINTS/PURPOSE OF CONSULTATION: Breast cancer  #  Oncology History Overview Note  # OCT 2020-RIGHT BREAST, UPPER INNER QUADRANT;  INVASIVE MAMMARY CARCINOMA, NO SPECIAL TYPE. S/p Lumpectomy & SLNBx [Dr.Cintron]pT1b pN0; G-1; ER/PR >90%; her 2neu-NEG;   # feb 1st 2021- Arimidex; STOPPED OCT 2023; sec to MSK- NOV Started Examestane; Worsening MSK pain- recommend DISCONTINUE exemestane - JAN 2025.   MARCH 2025- Start Letrozole  # survivorship: pending; LMP- 2011/ No TAH or BSO  DIAGNOSIS: Right breast cancer  STAGE:    1     ;  GOALS: Cure     Carcinoma of upper-inner quadrant of right breast in female, estrogen receptor positive (HCC)  06/07/2019 Initial Diagnosis   Carcinoma of upper-inner quadrant of right breast in female, estrogen receptor positive (HCC)    HISTORY OF PRESENTING ILLNESS: Alone.  Independently.  Lisa Jennings 63 y.o.  female with above history of of right breast cancer stage I ER/PRpos; her2 NEG on examestane is here for follow-up.  Patient has been off aromasin, has been off x6 week. S/p evaluation-noted to have tendinitis- improved.   Patient complains of worsening right leg foot pain.   No Breast discomfort.  Energy is good. Appetite is good.   Complains of mild hot flashes.   Review of Systems  Constitutional:  Negative for chills, diaphoresis, fever, malaise/fatigue and weight loss.  HENT:  Negative for nosebleeds and sore throat.   Eyes:  Negative for double vision.  Respiratory:  Negative for cough, hemoptysis, sputum production, shortness of breath and wheezing.   Cardiovascular:  Negative for chest pain, palpitations,  orthopnea and leg swelling.  Gastrointestinal:  Negative for abdominal pain, blood in stool, constipation, diarrhea, heartburn, melena, nausea and vomiting.  Genitourinary:  Negative for dysuria, frequency and urgency.  Musculoskeletal:  Positive for back pain and joint pain.  Skin: Negative.  Negative for itching and rash.  Neurological:  Negative for dizziness, tingling, focal weakness, weakness and headaches.  Endo/Heme/Allergies:  Does not bruise/bleed easily.  Psychiatric/Behavioral:  Negative for depression. The patient is not nervous/anxious and does not have insomnia.      MEDICAL HISTORY:  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   Closed fracture of neck of right radius    Closed fracture of scaphoid bone of wrist    De Quervain's tenosynovitis, right    Dyspnea    Family history of adverse reaction to anesthesia    nausea and vomiting   GERD (gastroesophageal reflux disease)    Headache    History of kidney stones    Hypertension    Impingement syndrome of right shoulder    Lateral epicondylitis    Morton's neuroma of left foot    Personal history of radiation therapy 2020   Right lumpectomy   Pneumonia    Trigger middle finger of left hand     SURGICAL HISTORY: Past Surgical History:  Procedure Laterality Date   APPENDECTOMY     BIOPSY  10/03/2023   Procedure: BIOPSY;  Surgeon: Regis Bill, MD;  Location: [facility] ENDOSCOPY;  Service: Endoscopy;;   BREAST BIOPSY Right 2016   neg/ bx -clip   BREAST BIOPSY Right 05/28/2019   x clip, stereo bx, invasive mammary carcinoma    BREAST LUMPECTOMY Right 06/11/2019   INVASIVE MAMMARY CARCINOMA   COLONOSCOPY WITH PROPOFOL N/A 10/03/2023   Procedure: COLONOSCOPY WITH PROPOFOL;  Surgeon: Regis Bill, MD;  Location: [facility]  ENDOSCOPY;  Service: Endoscopy;  Laterality: N/A;   ESOPHAGOGASTRODUODENOSCOPY (EGD) WITH PROPOFOL N/A 10/03/2023   Procedure: ESOPHAGOGASTRODUODENOSCOPY (EGD) WITH PROPOFOL;  Surgeon: Regis Bill, MD;  Location: [facility] END[facility];  Service: Endoscopy;  Laterality: N/A;   JOINT REPLACEMENT     LITHOTRIPSY     PARTIAL MASTECTOMY WITH NEEDLE LOCALIZATION AND AXILLARY SENTINEL LYMPH NODE BX Right 06/11/2019   Procedure: PARTIAL MASTECTOMY WITH NEEDLE LOCALIZATION AND AXILLARY SENTINEL LYMPH NODE BX;  Surgeon: Carolan Shiver, MD;  Location: [facility] ORS;  Service: General;  Laterality: Right;   REPLACEMENT TOTAL KNEE Right 03/21/2021   TENOTOMY HAND / FINGER PERCUTANEOUS     TUBAL LIGATION     WRIST CAPSULOTOMY     WRIST FRACTURE SURGERY      SOCIAL HISTORY: Social History   Socioeconomic History   Marital status: Divorced    Spouse name: Not on file   Number of children: Not on file   Years of education: Not on file   Highest education level: Not on file  Occupational History   Not on file  Tobacco Use   Smoking status: Never   Smokeless tobacco: Never  Vaping Use   Vaping status: Never Used  Substance and Sexual Activity   Alcohol use: Never   Drug use: Never   Sexual activity: Not Currently  Other Topics Concern   Not on file  Social History Narrative   Lives in [place]/ with brother/ son& family. 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    Social Drivers of Corporate investment banker Strain: Low Risk  (10/29/2023)   Received from [facility]   Overall Financial Resource Strain (CARDIA)    Difficulty of Paying Living Expenses: Not hard at all  Food Insecurity: No Food Insecurity (10/29/2023)   Received from [facility]   Hunger Vital Sign    Worried About Running Out of Food in the Last Year: Never true    Ran Out of Food in the Last Year: Never true  Transportation Needs: No Transportation Needs (10/29/2023)   Received  from [facility] - [facility]    In the past 12 months, has lack of transportation kept you from medical appointments or from getting medications?: No    Lack of Transportation (Non-Medical): No  Physical Activity: Not on file  Stress: Not on file  Social Connections: Not on file  Intimate Partner Violence: Not on file    FAMILY HISTORY: Family History  Problem Relation Age of Onset   Breast cancer Maternal Aunt 38   Diabetes Mother     ALLERGIES:  has no known allergies.  MEDICATIONS:  Current Outpatient Medications  Medication Sig Dispense Refill   acetaminophen (TYLENOL) 500 MG tablet Take 1,000 mg by mouth every 6 (six) hours as needed for moderate pain.     aspirin EC 81 MG tablet Take 81 mg by mouth daily.     Boswellia-Glucosamine-Vit D (OSTEO BI-FLEX ONE PER DAY PO) Take 1 tablet by mouth daily at 12 noon.     letrozole (FEMARA) 2.5 MG tablet  Take 1 tablet (2.5 mg total) by mouth daily. 3 tablet 3   meloxicam (MOBIC) 15 MG tablet Take 15 mg by mouth daily.     Multiple Vitamins-Minerals (CENTRUM SILVER ADULT 50+ PO) Take 1 tablet by mouth daily.     pantoprazole (PROTONIX) 40 MG tablet Take 40 mg by mouth daily.     pyridoxine (B-6) 100 MG tablet Take 100 mg by mouth daily.     valsartan-hydrochlorothiazide (DIOVAN-HCT) 320-12.5 MG tablet Take 1 tablet by mouth daily.     No current facility-administered medications for this visit.      Marland Kitchen  PHYSICAL EXAMINATION: ECOG PERFORMANCE STATUS: 0 - Asymptomatic  Vitals:   11/17/23 1039  BP: (!) 164/76  Pulse: 75  Resp: 18  Temp: 98.6 F (37 C)  SpO2: 98%   Filed Weights   11/17/23 1039  Weight: 246 lb 6.4 oz (111.8 kg)    Physical Exam HENT:     Head: Normocephalic and atraumatic.     Mouth/Throat:     Pharynx: No oropharyngeal exudate.  Eyes:     Pupils: Pupils are equal, round, and reactive to light.  Cardiovascular:     Rate and Rhythm: Normal rate and regular rhythm.   Pulmonary:     Effort: No respiratory distress.     Breath sounds: No wheezing.  Abdominal:     General: Bowel sounds are normal. There is no distension.     Palpations: Abdomen is soft. There is no mass.     Tenderness: There is no abdominal tenderness. There is no guarding or rebound.  Musculoskeletal:        General: No tenderness. Normal range of motion.     Cervical back: Normal range of motion and neck supple.  Skin:    General: Skin is warm.  Neurological:     Mental Status: She is alert and oriented to person, place, and time.  Psychiatric:        Mood and Affect: Affect normal.      LABORATORY DATA:  I have reviewed the data as listed Lab Results  Component Value Date   WBC 7.8 10/06/2023   HGB 13.6 10/06/2023   HCT 41.1 10/06/2023   MCV 90.7 10/06/2023   PLT 260 10/06/2023   Recent Labs    03/28/23 0943 04/13/23 1124 10/06/23 1005  NA 138 136 138  K 3.4* 3.7 3.7  CL 100 102 102  CO2 26 23 24   GLUCOSE 96 140* 94  BUN 15 15 17   CREATININE 0.80 1.03* 0.81  CALCIUM 10.8* 9.1 9.6  GFRNONAA >60 >60 >60  PROT 8.2* 8.7* 8.0  ALBUMIN 4.5 4.0 4.4  AST 27 26 30   ALT 27 23 28   ALKPHOS 115 104 106  BILITOT 0.7 1.0 0.9    RADIOGRAPHIC STUDIES: I have personally reviewed the radiological images as listed and agreed with the findings in the report. No results found.   ASSESSMENT & PLAN:   Carcinoma of upper-inner quadrant of right breast in female, estrogen receptor positive (HCC) # 2020- Stage I ER PR positive HER-2 negative breast cancer- Mammo-Dr.Cintron/oct 2024- -reviewed; within normal limits except for seroma. Arimidex [feb 1st, 2021].  On exemestane [nov 2023; until spring 2026]- ON HOLD sec to MSK- see below.   # However. Worsening MSK pain- recommend DISCONTINUE exemestane - JAN 2025. Start Letrozole  # MSK [Right heel pain]- sec to exemestane-will switch to [employer]. Scipt sent-   # Hot flashes G-1-sec to examestane- monitor for now-  stable.    # JAN 2025- BMD- T-score of -0.6. Normal-HOLD calcium plus vitamin D- see below. Stable.    # Mild Hypercalcemia- Ca 10.8- monitor for now. NO ca+vit D [on HCTZ]. Pending-PTH-   # Screening: JAN 2025- colonoscopy/ EGD [[facility]]- NEG.   # DISPOSITION:  # Follow up in 3 months- MD: no labs- Dr.B  All questions were answered. The patient/family knows to call the clinic with any problems, questions or concerns.    Earna Coder, MD 11/17/2023 11:29 AM

## 2023-11-18 ENCOUNTER — Other Ambulatory Visit: Payer: Self-pay | Admitting: Internal Medicine

## 2023-11-18 ENCOUNTER — Telehealth: Payer: Self-pay | Admitting: *Deleted

## 2023-11-18 MED ORDER — LETROZOLE 2.5 MG PO TABS: 2.5000 mg | ORAL_TABLET | Freq: Every day | ORAL | 3 refills | Status: DC

## 2023-11-18 NOTE — Telephone Encounter (Signed)
 She only got 3 pills and I spoke to her and dr Donneta Romberg and it was suppose 30 tablets and it did nt go correctly . He put in the correct one today and she can get the correct amount of pills for the pt. She is ok with this/

## 2024-02-17 ENCOUNTER — Encounter: Payer: Self-pay | Admitting: Internal Medicine

## 2024-02-17 ENCOUNTER — Inpatient Hospital Stay: Attending: Internal Medicine | Admitting: Internal Medicine

## 2024-02-17 VITALS — BP 139/76 | HR 84 | Temp 99.1°F | Resp 18 | Ht 64.0 in | Wt 241.6 lb

## 2024-02-17 DIAGNOSIS — Z803 Family history of malignant neoplasm of breast: Secondary | ICD-10-CM | POA: Diagnosis not present

## 2024-02-17 DIAGNOSIS — Z1721 Progesterone receptor positive status: Secondary | ICD-10-CM | POA: Insufficient documentation

## 2024-02-17 DIAGNOSIS — Z79811 Long term (current) use of aromatase inhibitors: Secondary | ICD-10-CM | POA: Diagnosis not present

## 2024-02-17 DIAGNOSIS — R232 Flushing: Secondary | ICD-10-CM | POA: Insufficient documentation

## 2024-02-17 DIAGNOSIS — Z170 Estrogen receptor positive status [ER+]: Secondary | ICD-10-CM | POA: Insufficient documentation

## 2024-02-17 DIAGNOSIS — M791 Myalgia, unspecified site: Secondary | ICD-10-CM | POA: Insufficient documentation

## 2024-02-17 DIAGNOSIS — C50211 Malignant neoplasm of upper-inner quadrant of right female breast: Secondary | ICD-10-CM | POA: Insufficient documentation

## 2024-02-17 DIAGNOSIS — Z1732 Human epidermal growth factor receptor 2 negative status: Secondary | ICD-10-CM | POA: Diagnosis not present

## 2024-02-17 MED ORDER — LETROZOLE 2.5 MG PO TABS: 2.5000 mg | ORAL_TABLET | Freq: Every day | ORAL | 1 refills | Status: DC

## 2024-02-17 NOTE — Progress Notes (Signed)
 [facility] CONSULT NOTE  Patient Care Team: [place], [facility] as PCP - General Rennie Cindy SAUNDERS, MD as Consulting Physician (Internal Medicine) Rodolph Romano, MD as Consulting Physician (General Surgery) Lenn Aran, MD as Referring Physician (Radiation Oncology)  CHIEF COMPLAINTS/PURPOSE OF CONSULTATION: Breast cancer  #  Oncology History Overview Note  # OCT 2020-RIGHT BREAST, UPPER INNER QUADRANT;  INVASIVE MAMMARY CARCINOMA, NO SPECIAL TYPE. S/p Lumpectomy & SLNBx [Dr.Cintron]pT1b pN0; G-1; ER/PR >90%; her 2neu-NEG;   # feb 1st 2021- Arimidex ; STOPPED OCT 2023; sec to [facility]- NOV Started Examestane; Worsening MSK pain- recommend DISCONTINUE exemestane  - JAN 2025.   MARCH 2025- Start Letrozole   # survivorship: pending; LMP- 2011/ No TAH or BSO  DIAGNOSIS: Right breast cancer  STAGE:    1     ;  GOALS: Cure     Carcinoma of upper-inner quadrant of right breast in female, estrogen receptor positive (HCC)  06/07/2019 Initial Diagnosis   Carcinoma of upper-inner quadrant of right breast in female, estrogen receptor positive (HCC)    HISTORY OF PRESENTING ILLNESS: Alone.  Independently.  Janeice Joshua Hsu 63 y.o.  female with above history of of right breast cancer stage I ER/PRpos; her2 NEG on letrozole   is here for follow-up.  Patient continues to have joint pains- not any significantly worse.   No Breast discomfort.  Energy is good. Appetite is good.   Complains of mild hot flashes.  Review of Systems  Constitutional:  Negative for chills, diaphoresis, fever, malaise/fatigue and weight loss.  HENT:  Negative for nosebleeds and sore throat.   Eyes:  Negative for double vision.  Respiratory:  Negative for cough, hemoptysis, sputum production, shortness of breath and wheezing.   Cardiovascular:  Negative for chest pain, palpitations, orthopnea and leg swelling.  Gastrointestinal:  Negative for abdominal pain, blood in stool,  constipation, diarrhea, heartburn, melena, nausea and vomiting.  Genitourinary:  Negative for dysuria, frequency and urgency.  Musculoskeletal:  Positive for back pain and joint pain.  Skin: Negative.  Negative for itching and rash.  Neurological:  Negative for dizziness, tingling, focal weakness, weakness and headaches.  Endo/Heme/Allergies:  Does not bruise/bleed easily.  Psychiatric/Behavioral:  Negative for depression. The patient is not nervous/anxious and does not have insomnia.      MEDICAL HISTORY:  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   Closed fracture of neck of right radius    Closed fracture of scaphoid bone of wrist    De Quervain's tenosynovitis, right    Dyspnea    Family history of adverse reaction to anesthesia    nausea and vomiting   GERD (gastroesophageal reflux disease)    Headache    History of kidney stones    Hypertension    Impingement syndrome of right shoulder    Lateral epicondylitis    Morton's neuroma of left foot    Personal history of radiation therapy 2020   Right lumpectomy   Pneumonia    Trigger middle finger of left hand     SURGICAL HISTORY: Past Surgical History:  Procedure Laterality Date   APPENDECTOMY     BIOPSY  10/03/2023   Procedure: BIOPSY;  Surgeon: Maryruth Ole DASEN, MD;  Location: [facility];  Service: Endoscopy;;   BREAST BIOPSY Right 2016   neg/ bx -  clip   BREAST BIOPSY Right 05/28/2019   x clip, stereo bx, invasive mammary carcinoma    BREAST LUMPECTOMY Right 06/11/2019   INVASIVE MAMMARY CARCINOMA   COLONOSCOPY WITH PROPOFOL  N/A 10/03/2023   Procedure: COLONOSCOPY WITH PROPOFOL ;  Surgeon: Maryruth Ole DASEN, MD;  Location: [facility] END[facility];  Service: Endoscopy;  Laterality: N/A;   ESOPHAGOGASTRODUODENOSCOPY (EGD) WITH PROPOFOL   N/A 10/03/2023   Procedure: ESOPHAGOGASTRODUODENOSCOPY (EGD) WITH PROPOFOL ;  Surgeon: Maryruth Ole DASEN, MD;  Location: [facility];  Service: Endoscopy;  Laterality: N/A;   JOINT REPLACEMENT     LITHOTRIPSY     PARTIAL MASTECTOMY WITH NEEDLE LOCALIZATION AND AXILLARY SENTINEL LYMPH NODE BX Right 06/11/2019   Procedure: PARTIAL MASTECTOMY WITH NEEDLE LOCALIZATION AND AXILLARY SENTINEL LYMPH NODE BX;  Surgeon: Rodolph Romano, MD;  Location: [facility] ORS;  Service: General;  Laterality: Right;   REPLACEMENT TOTAL KNEE Right 03/21/2021   TENOTOMY HAND / FINGER PERCUTANEOUS     TUBAL LIGATION     WRIST CAPSULOTOMY     WRIST FRACTURE SURGERY      SOCIAL HISTORY: Social History   Socioeconomic History   Marital status: Divorced    Spouse name: Not on file   Number of children: Not on file   Years of education: Not on file   Highest education level: Not on file  Occupational History   Not on file  Tobacco Use   Smoking status: Never   Smokeless tobacco: Never  Vaping Use   Vaping status: Never Used  Substance and Sexual Activity   Alcohol use: Never   Drug use: Never   Sexual activity: Not Currently  Other Topics Concern   Not on file  Social History Narrative   Lives in [place]/ with brother/ son& family. 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    Social Drivers of Corporate investment banker Strain: Low Risk  (11/28/2023)   Received from [facility]   Overall Financial Resource Strain (CARDIA)    Difficulty of Paying Living Expenses: Not hard at all  Food Insecurity: No Food Insecurity (11/28/2023)   Received from [facility]   Hunger Vital Sign    Within the past 12 months, you worried that your food would run out before you got the money to buy more.: Never true    Within the past 12 months, the food you bought just didn't last and you didn't have money to get more.: Never true  Transportation Needs: No Transportation Needs  (11/28/2023)   Received from [facility] - Transportation    In the past 12 months, has lack of transportation kept you from medical appointments or from getting medications?: No    Lack of Transportation (Non-Medical): No  Physical Activity: Not on file  Stress: Not on file  Social Connections: Not on file  Intimate Partner Violence: Not on file    FAMILY HISTORY: Family History  Problem Relation Age of Onset   Breast cancer Maternal Aunt 4   Diabetes Mother     ALLERGIES:  has no known allergies.  MEDICATIONS:  Current Outpatient Medications  Medication Sig Dispense Refill   acetaminophen  (TYLENOL ) 500 MG tablet Take 1,000 mg by mouth every 6 (six) hours as needed for moderate pain.     aspirin EC 81 MG tablet Take 81 mg by mouth daily.     Boswellia-Glucosamine-Vit D (OSTEO BI-FLEX ONE PER DAY PO) Take 1 tablet by mouth daily at 12 noon.  meloxicam (MOBIC) 15 MG tablet Take 15 mg by mouth daily.     Multiple Vitamins-Minerals (CENTRUM SILVER  ADULT 50+ PO) Take 1 tablet by mouth daily.     pantoprazole (PROTONIX) 40 MG tablet Take 40 mg by mouth daily.     pyridoxine (B-6) 100 MG tablet Take 100 mg by mouth daily.     valsartan-hydrochlorothiazide (DIOVAN-HCT) 320-12.5 MG tablet Take 1 tablet by mouth daily.     letrozole  (FEMARA ) 2.5 MG tablet Take 1 tablet (2.5 mg total) by mouth daily. 90 tablet 1   No current facility-administered medications for this visit.      SABRA  PHYSICAL EXAMINATION: ECOG PERFORMANCE STATUS: 0 - Asymptomatic  Vitals:   02/17/24 1024  BP: 139/76  Pulse: 84  Resp: 18  Temp: 99.1 F (37.3 C)  SpO2: 99%   Filed Weights   02/17/24 1024  Weight: 241 lb 9.6 oz (109.6 kg)    Physical Exam HENT:     Head: Normocephalic and atraumatic.     Mouth/Throat:     Pharynx: No oropharyngeal exudate.   Eyes:     Pupils: Pupils are equal, round, and reactive to light.    Cardiovascular:     Rate and Rhythm: Normal  rate and regular rhythm.  Pulmonary:     Effort: No respiratory distress.     Breath sounds: No wheezing.  Abdominal:     General: Bowel sounds are normal. There is no distension.     Palpations: Abdomen is soft. There is no mass.     Tenderness: There is no abdominal tenderness. There is no guarding or rebound.   Musculoskeletal:        General: No tenderness. Normal range of motion.     Cervical back: Normal range of motion and neck supple.   Skin:    General: Skin is warm.   Neurological:     Mental Status: She is alert and oriented to person, place, and time.   Psychiatric:        Mood and Affect: Affect normal.      LABORATORY DATA:  I have reviewed the data as listed Lab Results  Component Value Date   WBC 7.8 10/06/2023   HGB 13.6 10/06/2023   HCT 41.1 10/06/2023   MCV 90.7 10/06/2023   PLT 260 10/06/2023   Recent Labs    03/28/23 0943 04/13/23 1124 10/06/23 1005  NA 138 136 138  K 3.4* 3.7 3.7  CL 100 102 102  CO2 26 23 24   GLUCOSE 96 140* 94  BUN 15 15 17   CREATININE 0.80 1.03* 0.81  CALCIUM 10.8* 9.1 9.6  GFRNONAA >60 >60 >60  PROT 8.2* 8.7* 8.0  ALBUMIN 4.5 4.0 4.4  AST 27 26 30   ALT 27 23 28   ALKPHOS 115 104 106  BILITOT 0.7 1.0 0.9    RADIOGRAPHIC STUDIES: I have personally reviewed the radiological images as listed and agreed with the findings in the report. No results found.   ASSESSMENT & PLAN:   Carcinoma of upper-inner quadrant of right breast in female, estrogen receptor positive (HCC) # 2020- Stage I ER PR positive HER-2 negative breast cancer- Mammo-Dr.Cintron/oct 2024- -reviewed; within normal limits except for seroma. Arimidex  [feb 1st, 2021].  On exemestane  [nov 2023; until spring 2026]-DISCONTINU sec to [facility] 2025. Started Letrozole .  # tolerating well with mild to moderate side effects- if worse- then consider tamoxifen. Continue letrozole  for now.   # MSK pain- improved/ stable on Letrozole .   #  Hot flashes G-1-sec to  AI- monitor for now- stable.   # JAN 2025- BMD- T-score of -0.6. Normal- HOLD calcium plus vitamin D -sec to int remittent hypercalcemia-  stable.  # Mild Hypercalcemia- Ca 10.8- monitor for now. NO ca+vit D [on HCTZ]. Pending-PTH-   # Screening: JAN 2025- colonoscopy/ EGD [[facility]]- NEG.   # DISPOSITION:  # Follow up in 7 -JAN 2026- months- MD: labs- cbc/cmp; Vit D 25-OH-  Dr.B  All questions were answered. The patient/family knows to call the clinic with any problems, questions or concerns.    Cindy JONELLE Joe, MD 02/17/2024 11:21 AM

## 2024-02-17 NOTE — Assessment & Plan Note (Signed)
#   2020- Stage I ER PR positive HER-2 negative breast cancer- Mammo-Dr.Cintron/oct 2024- -reviewed; within normal limits except for seroma. Arimidex  [feb 1st, 2021].  On exemestane  [nov 2023; until spring 2026]-DISCONTINU sec to [facility] 2025. Started Letrozole .  # tolerating well with mild to moderate side effects- if worse- then consider tamoxifen. Continue letrozole  for now.   # MSK pain- improved/ stable on Letrozole .   # Hot flashes G-1-sec to AI- monitor for now- stable.   # JAN 2025- BMD- T-score of -0.6. Normal- HOLD calcium plus vitamin D -sec to int remittent hypercalcemia-  stable.  # Mild Hypercalcemia- Ca 10.8- monitor for now. NO ca+vit D [on HCTZ]. Pending-PTH-   # Screening: JAN 2025- colonoscopy/ EGD [[facility]]- NEG.   # DISPOSITION:  # Follow up in 7 -JAN 2026- months- MD: labs- cbc/cmp; Vit D 25-OH-  Dr.B

## 2024-02-17 NOTE — Progress Notes (Signed)
 No concerns today

## 2024-04-28 ENCOUNTER — Other Ambulatory Visit: Payer: Self-pay | Admitting: General Surgery

## 2024-04-28 DIAGNOSIS — Z1231 Encounter for screening mammogram for malignant neoplasm of breast: Secondary | ICD-10-CM

## 2024-06-03 ENCOUNTER — Ambulatory Visit
Admission: RE | Admit: 2024-06-03 | Discharge: 2024-06-03 | Disposition: A | Discharge status: Home / Self Care | Source: Ambulatory Visit | Attending: General Surgery | Admitting: General Surgery

## 2024-06-03 DIAGNOSIS — Z1231 Encounter for screening mammogram for malignant neoplasm of breast: Secondary | ICD-10-CM | POA: Insufficient documentation

## 2024-06-07 ENCOUNTER — Other Ambulatory Visit: Payer: Self-pay | Admitting: General Surgery

## 2024-06-07 DIAGNOSIS — R928 Other abnormal and inconclusive findings on diagnostic imaging of breast: Secondary | ICD-10-CM

## 2024-06-09 ENCOUNTER — Ambulatory Visit
Admission: RE | Admit: 2024-06-09 | Discharge: 2024-06-09 | Disposition: A | Discharge status: Home / Self Care | Source: Ambulatory Visit | Attending: General Surgery | Admitting: General Surgery

## 2024-06-09 ENCOUNTER — Encounter: Payer: Self-pay | Admitting: Ophthalmology

## 2024-06-09 DIAGNOSIS — R928 Other abnormal and inconclusive findings on diagnostic imaging of breast: Secondary | ICD-10-CM

## 2024-06-09 NOTE — Anesthesia Preprocedure Evaluation (Addendum)
 Anesthesia Evaluation  Patient identified by MRN, date of birth, ID band Patient awake    Reviewed: Allergy & Precautions, H&P , NPO status , Patient's Chart, lab work & pertinent test results  History of Anesthesia Complications (+) Family history of anesthesia reaction  Airway Mallampati: III  TM Distance: >3 FB Neck ROM: Full    Dental no notable dental hx.    Pulmonary neg pulmonary ROS, sleep apnea , pneumonia   Pulmonary exam normal breath sounds clear to auscultation       Cardiovascular hypertension, negative cardio ROS Normal cardiovascular exam Rhythm:Regular Rate:Normal     Neuro/Psych  Headaches  Neuromuscular disease negative neurological ROS  negative psych ROS   GI/Hepatic negative GI ROS, Neg liver ROS,GERD  ,,  Endo/Other  negative endocrine ROS    Renal/GU negative Renal ROS  negative genitourinary   Musculoskeletal negative musculoskeletal ROS (+) Arthritis ,    Abdominal   Peds negative pediatric ROS (+)  Hematology negative hematology ROS (+)   Anesthesia Other Findings Hypertension  Carcinoma of upper-inner quadrant of right breast in female, estrogen receptor positive (HCC) Family history of adverse reaction to anesthesia is PONV  Pneumonia GERD (gastroesophageal reflux disease) History of kidney stones Headache  Breast cancer (HCC) right breast Personal history of radiation therapy Closed fracture of neck of right radius Closed fracture of scaphoid bone of wrist Arthritis De Quervain's tenosynovitis, right  Morton's neuroma of left foot Achilles tendinitis of right lower extremity Trigger middle finger of left hand Impingement syndrome of right shoulder Lateral epicondylitis Sleep apnea Pre-diabete    Reproductive/Obstetrics negative OB ROS                              Anesthesia Physical Anesthesia Plan  ASA: 3  Anesthesia Plan: MAC   Post-op Pain  Management:    Induction: Intravenous  PONV Risk Score and Plan:   Airway Management Planned: Natural Airway and Nasal Cannula  Additional Equipment:   Intra-op Plan:   Post-operative Plan:   Informed Consent: I have reviewed the patients History and Physical, chart, labs and discussed the procedure including the risks, benefits and alternatives for the proposed anesthesia with the patient or authorized representative who has indicated his/her understanding and acceptance.     Dental Advisory Given  Plan Discussed with: Anesthesiologist, CRNA and Surgeon  Anesthesia Plan Comments: (Patient consented for risks of anesthesia including but not limited to:  - adverse reactions to medications - damage to eyes, teeth, lips or other oral mucosa - nerve damage due to positioning  - sore throat or hoarseness - Damage to heart, brain, nerves, lungs, other parts of body or loss of life  Patient voiced understanding and assent.)         Anesthesia Quick Evaluation

## 2024-06-10 NOTE — Discharge Instructions (Signed)

## 2024-06-15 ENCOUNTER — Ambulatory Visit
Admission: RE | Admit: 2024-06-15 | Discharge: 2024-06-15 | Disposition: A | Discharge status: Home / Self Care | Attending: Ophthalmology | Admitting: Ophthalmology

## 2024-06-15 ENCOUNTER — Encounter
Admission: RE | Disposition: A | Discharge status: Home / Self Care | Payer: Self-pay | Source: Home / Self Care | Attending: Ophthalmology

## 2024-06-15 ENCOUNTER — Other Ambulatory Visit: Payer: Self-pay

## 2024-06-15 ENCOUNTER — Encounter: Payer: Self-pay | Admitting: Ophthalmology

## 2024-06-15 ENCOUNTER — Ambulatory Visit: Payer: Self-pay | Admitting: Anesthesiology

## 2024-06-15 DIAGNOSIS — G4730 Sleep apnea, unspecified: Secondary | ICD-10-CM | POA: Insufficient documentation

## 2024-06-15 DIAGNOSIS — H2511 Age-related nuclear cataract, right eye: Secondary | ICD-10-CM | POA: Diagnosis present

## 2024-06-15 DIAGNOSIS — K219 Gastro-esophageal reflux disease without esophagitis: Secondary | ICD-10-CM | POA: Insufficient documentation

## 2024-06-15 DIAGNOSIS — I10 Essential (primary) hypertension: Secondary | ICD-10-CM | POA: Insufficient documentation

## 2024-06-15 HISTORY — DX: Sleep apnea, unspecified: G47.30

## 2024-06-15 HISTORY — PX: CATARACT EXTRACTION W/PHACO: SHX586

## 2024-06-15 HISTORY — DX: Prediabetes: R73.03

## 2024-06-15 SURGERY — PHACOEMULSIFICATION, CATARACT, WITH IOL INSERTION
Anesthesia: Monitor Anesthesia Care | Site: Eye | Laterality: Right

## 2024-06-15 MED ORDER — LACTATED RINGERS IV SOLN: INTRAVENOUS | Status: DC

## 2024-06-15 MED ORDER — MIDAZOLAM HCL (PF) 2 MG/2ML IJ SOLN
INTRAMUSCULAR | Status: DC | PRN
  Administered 2024-06-15: 2 mg via INTRAVENOUS

## 2024-06-15 MED ORDER — FENTANYL CITRATE (PF) 100 MCG/2ML IJ SOLN
INTRAMUSCULAR | Status: AC
  Filled 2024-06-15: qty 2

## 2024-06-15 MED ORDER — FENTANYL CITRATE (PF) 100 MCG/2ML IJ SOLN
INTRAMUSCULAR | Status: DC | PRN
  Administered 2024-06-15: 50 ug via INTRAVENOUS

## 2024-06-15 MED ORDER — SIGHTPATH DOSE#1 BSS IO SOLN
INTRAOCULAR | Status: DC | PRN
  Administered 2024-06-15: 15 mL via INTRAOCULAR

## 2024-06-15 MED ORDER — TETRACAINE HCL 0.5 % OP SOLN
OPHTHALMIC | Status: AC
  Filled 2024-06-15: qty 4

## 2024-06-15 MED ORDER — MOXIFLOXACIN HCL 0.5 % OP SOLN
OPHTHALMIC | Status: DC | PRN
  Administered 2024-06-15: .2 mL via OPHTHALMIC

## 2024-06-15 MED ORDER — SIGHTPATH DOSE#1 NA CHONDROIT SULF-NA HYALURON 40-17 MG/ML IO SOLN
INTRAOCULAR | Status: DC | PRN
  Administered 2024-06-15: 1 mL via INTRAOCULAR

## 2024-06-15 MED ORDER — TETRACAINE HCL 0.5 % OP SOLN
1.0000 [drp] | OPHTHALMIC | Status: DC | PRN
  Administered 2024-06-15 (×3): 1 [drp] via OPHTHALMIC

## 2024-06-15 MED ORDER — ARMC OPHTHALMIC DILATING DROPS
1.0000 | OPHTHALMIC | Status: DC | PRN
Start: 2024-06-15 — End: 2024-06-15
  Administered 2024-06-15 (×3): 1 via OPHTHALMIC

## 2024-06-15 MED ORDER — BRIMONIDINE TARTRATE-TIMOLOL 0.2-0.5 % OP SOLN
OPHTHALMIC | Status: DC | PRN
  Administered 2024-06-15: 1 [drp] via OPHTHALMIC

## 2024-06-15 MED ORDER — ARMC OPHTHALMIC DILATING DROPS
OPHTHALMIC | Status: AC
  Filled 2024-06-15: qty 0.5

## 2024-06-15 MED ORDER — LIDOCAINE HCL (PF) 2 % IJ SOLN
INTRAOCULAR | Status: DC | PRN
  Administered 2024-06-15: 2 mL

## 2024-06-15 MED ORDER — MIDAZOLAM HCL 2 MG/2ML IJ SOLN
INTRAMUSCULAR | Status: AC
  Filled 2024-06-15: qty 2

## 2024-06-15 MED ORDER — TETRACAINE HCL 0.5 % OP SOLN
OPHTHALMIC | Status: AC
Start: 2024-06-15 — End: 2024-06-15
  Filled 2024-06-15: qty 4

## 2024-06-15 MED ORDER — SIGHTPATH DOSE#1 BSS IO SOLN
INTRAOCULAR | Status: DC | PRN
  Administered 2024-06-15: 42 mL via OPHTHALMIC

## 2024-06-15 SURGICAL SUPPLY — 10 items
CANNULA ANT/CHMB 27G (MISCELLANEOUS) ×1 IMPLANT
CYSTOTOME ANGL RVRS SHRT 25G (CUTTER) ×1 IMPLANT
FEE CATARACT SUITE SIGHTPATH (MISCELLANEOUS) ×1 IMPLANT
GLOVE BIOGEL PI IND STRL 8 (GLOVE) ×1 IMPLANT
GLOVE SURG LX STRL 8.0 MICRO (GLOVE) ×1 IMPLANT
GLOVE SURG SYN 6.5 PF PI BL (GLOVE) ×1 IMPLANT
LENS IOL TECNIS EYHANCE 20.0 (Intraocular Lens) IMPLANT
NDL FILTER BLUNT 18X1 1/2 (NEEDLE) ×1 IMPLANT
NEEDLE FILTER BLUNT 18X1 1/2 (NEEDLE) ×1 IMPLANT
SYR 3ML LL SCALE MARK (SYRINGE) ×1 IMPLANT

## 2024-06-15 NOTE — Anesthesia Postprocedure Evaluation (Signed)
 Anesthesia Post Note  Patient: Lisa Jennings  Procedure(s) Performed: PHACOEMULSIFICATION, CATARACT, WITH IOL INSERTION 5.50 00:33.7 (Right: Eye)  Patient location during evaluation: PACU Anesthesia Type: MAC Level of consciousness: awake and alert Pain management: pain level controlled Vital Signs Assessment: post-procedure vital signs reviewed and stable Respiratory status: spontaneous breathing, nonlabored ventilation, respiratory function stable and patient connected to nasal cannula oxygen Cardiovascular status: stable and blood pressure returned to baseline Postop Assessment: no apparent nausea or vomiting Anesthetic complications: no   No notable events documented.   Last Vitals:  Vitals:   06/15/24 0839 06/15/24 0843  BP: 135/72 134/71  Pulse: 65 62  Resp: (!) 9 16  Temp: (!) 36.3 C (!) 36.3 C  SpO2: 100% 98%    Last Pain:  Vitals:   06/15/24 0843  TempSrc:   PainSc: 0-No pain                 Donny JAYSON Mu

## 2024-06-15 NOTE — Op Note (Signed)
 PREOPERATIVE DIAGNOSIS:  Nuclear sclerotic cataract of the right eye.   POSTOPERATIVE DIAGNOSIS:  Right Eye Cataract   OPERATIVE PROCEDURE:ORPROCALL@   SURGEON:  Elsie Carmine, MD.   ANESTHESIA:  Anesthesiologist: Ola Donny BROCKS, MD CRNA: Myra Lawless, CRNA  1.      Managed anesthesia care. 2.      0.9ml of Shugarcaine was instilled in the eye following the paracentesis.   COMPLICATIONS:  None.   TECHNIQUE:   Stop and chop   DESCRIPTION OF PROCEDURE:  The patient was examined and consented in the preoperative holding area where the aforementioned topical anesthesia was applied to the right eye and then brought back to the Operating Room where the right eye was prepped and draped in the usual sterile ophthalmic fashion and a lid speculum was placed. A paracentesis was created with the side port blade and the anterior chamber was filled with viscoelastic. A near clear corneal incision was performed with the steel keratome. A continuous curvilinear capsulorrhexis was performed with a cystotome followed by the capsulorrhexis forceps. Hydrodissection and hydrodelineation were carried out with BSS on a blunt cannula. The lens was removed in a stop and chop  technique and the remaining cortical material was removed with the irrigation-aspiration handpiece. The capsular bag was inflated with viscoelastic and the intraocular lens was placed in the capsular bag without complication. The remaining viscoelastic was removed from the eye with the irrigation-aspiration handpiece. The wounds were hydrated. The anterior chamber was flushed with BSS and the eye was inflated to physiologic pressure. 0.1ml of Vigamox was placed in the anterior chamber. The wounds were found to be water tight. The eye was dressed with Combigan. The patient was given protective glasses to wear throughout the day and a shield with which to sleep tonight. The patient was also given drops with which to begin a drop regimen today  and will follow-up with me in one day. Implant Name Type Inv. Item Serial No. Manufacturer Lot No. LRB No. Used Action  LENS IOL TECNIS EYHANCE 20.0 - [id number] Intraocular Lens LENS IOL TECNIS EYHANCE 20.0 [id number] [employer]  Right 1 Implanted   Procedure(s): PHACOEMULSIFICATION, CATARACT, WITH IOL INSERTION 5.50 00:33.7 (Right)  Electronically signed: Elsie Carmine 06/15/2024 8:37 AM

## 2024-06-15 NOTE — Transfer of Care (Signed)
 Immediate Anesthesia Transfer of Care Note  Patient: Lisa Jennings  Procedure(s) Performed: PHACOEMULSIFICATION, CATARACT, WITH IOL INSERTION 5.50 00:33.7 (Right: Eye)  Patient Location: PACU  Anesthesia Type: MAC  Level of Consciousness: awake, alert  and patient cooperative  Airway and Oxygen Therapy: Patient Spontanous Breathing and Patient connected to supplemental oxygen  Post-op Assessment: Post-op Vital signs reviewed, Patient's Cardiovascular Status Stable, Respiratory Function Stable, Patent Airway and No signs of Nausea or vomiting  Post-op Vital Signs: Reviewed and stable  Complications: No notable events documented.

## 2024-06-15 NOTE — H&P (Signed)
 [facility]   Primary Care Physician:  Lauran [facility] Ophthalmologist: Dr. Elsie Carmine  Pre-Procedure History & Physical: HPI:  Lisa Jennings is a 63 y.o. female here for cataract surgery.   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   Closed fracture of neck of right radius    Closed fracture of scaphoid bone of wrist    De Quervain's tenosynovitis, right    Family history of adverse reaction to anesthesia    nausea and vomiting   GERD (gastroesophageal reflux disease)    Headache    History of kidney stones    Hypertension    Impingement syndrome of right shoulder    Lateral epicondylitis    Morton's neuroma of left foot    Personal history of radiation therapy 2020   Right lumpectomy   Pneumonia    Pre-diabetes    Sleep apnea    USES CPAP   Trigger middle finger of left hand     Past Surgical History:  Procedure Laterality Date   APPENDECTOMY     BIOPSY  10/03/2023   Procedure: BIOPSY;  Surgeon: Maryruth Ole DASEN, MD;  Location: [facility];  Service: Endoscopy;;   BREAST BIOPSY Right 2016   neg/ bx -clip   BREAST BIOPSY Right 05/28/2019   x clip, stereo bx, invasive mammary carcinoma    BREAST LUMPECTOMY Right 06/11/2019   INVASIVE MAMMARY CARCINOMA   COLONOSCOPY WITH PROPOFOL  N/A 10/03/2023   Procedure: COLONOSCOPY WITH PROPOFOL ;  Surgeon: Maryruth Ole DASEN, MD;  Location: [facility];  Service: Endoscopy;  Laterality: N/A;   ESOPHAGOGASTRODUODENOSCOPY (EGD) WITH PROPOFOL  N/A 10/03/2023   Procedure: ESOPHAGOGASTRODUODENOSCOPY (EGD) WITH PROPOFOL ;  Surgeon: Maryruth Ole DASEN, MD;  Location: [facility];  Service: Endoscopy;  Laterality: N/A;   JOINT REPLACEMENT     LITHOTRIPSY     PARTIAL MASTECTOMY WITH NEEDLE LOCALIZATION AND AXILLARY  SENTINEL LYMPH NODE BX Right 06/11/2019   Procedure: PARTIAL MASTECTOMY WITH NEEDLE LOCALIZATION AND AXILLARY SENTINEL LYMPH NODE BX;  Surgeon: Rodolph Romano, MD;  Location: [facility] ORS;  Service: General;  Laterality: Right;   REPLACEMENT TOTAL KNEE Right 03/21/2021   TENOTOMY HAND / FINGER PERCUTANEOUS     TUBAL LIGATION     WRIST CAPSULOTOMY     WRIST FRACTURE SURGERY      Prior to Admission medications   Medication Sig Start Date End Date Taking? Authorizing Provider  acetaminophen  (TYLENOL ) 500 MG tablet Take 1,000 mg by mouth every 6 (six) hours as needed for moderate pain.   Yes [provider]  aspirin EC 81 MG tablet Take 81 mg by mouth daily.   Yes [provider]  Boswellia-Glucosamine-Vit D (OSTEO BI-FLEX ONE PER DAY PO) Take 1 tablet by mouth daily at 12 noon.   Yes [provider]  letrozole  (FEMARA ) 2.5 MG tablet Take 1 tablet (2.5 mg total) by mouth daily. 02/17/24  Yes Brahmanday, Govinda R, MD  meloxicam (MOBIC) 15 MG tablet Take 15 mg by mouth daily. 04/07/19  Yes [provider]  Multiple Vitamins-Minerals (CENTRUM SILVER  ADULT 50+ PO) Take 1 tablet by mouth daily.   Yes [provider]  pantoprazole (PROTONIX) 40 MG tablet Take 40 mg by mouth daily. 03/25/23  Yes [provider]  pyridoxine (B-6) 100 MG tablet Take 100  mg by mouth daily.   Yes [provider]  valsartan-hydrochlorothiazide (DIOVAN-HCT) 320-12.5 MG tablet Take 1 tablet by mouth daily.   Yes [provider]    Allergies as of 05/31/2024   (No Known Allergies)    Family History  Problem Relation Age of Onset   Breast cancer Maternal Aunt 18   Diabetes Mother     Social History   Socioeconomic History   Marital status: Divorced    Spouse name: Not on file   Number of children: Not on file   Years of education: Not on file   Highest education level: Not on file  Occupational History   Not on file  Tobacco Use   Smoking  status: Never   Smokeless tobacco: Never  Vaping Use   Vaping status: Never Used  Substance and Sexual Activity   Alcohol use: Never   Drug use: Never   Sexual activity: Not Currently  Other Topics Concern   Not on file  Social History Narrative   Lives in [place]/ with brother/ son& family. 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    Social Drivers of Corporate investment banker Strain: Low Risk  (11/28/2023)   Received from [facility]   Overall Financial Resource Strain (CARDIA)    Difficulty of Paying Living Expenses: Not hard at all  Food Insecurity: No Food Insecurity (11/28/2023)   [facility]   Hunger Vital Sign    Within the past 12 months, you worried that your food would run out before you got the money to buy more.: Never true    Within the past 12 months, the food you bought just didn't last and you didn't have money to get more.: Never true  Transportation Needs: No Transportation Needs (11/28/2023)   [facility]    In the past 12 months, has lack of transportation kept you from medical appointments or from getting medications?: No    Lack of Transportation (Non-Medical): No  Physical Activity: Not on file  Stress: Not on file  Social Connections: Not on file  Intimate Partner Violence: Not on file    Review of Systems: See HPI, otherwise negative ROS  Physical Exam: BP (!) 153/85   Pulse 63   Temp 97.7 F (36.5 C) (Temporal)   Resp 18   Ht 5' 4 (1.626 m)   Wt 108.4 kg   SpO2 97%   BMI 41.02 kg/m  General:   Alert, cooperative. Head:  Normocephalic and atraumatic. Respiratory:  Normal work of breathing. Cardiovascular:  NAD  Impression/Plan: Lisa Jennings is here for cataract surgery.  Risks, benefits, limitations, and alternatives regarding cataract surgery have been reviewed with the patient.  Questions have been answered.   All parties agreeable.   Elsie Carmine, MD  06/15/2024, 8:13 AM

## 2024-06-24 NOTE — Discharge Instructions (Signed)

## 2024-06-29 ENCOUNTER — Ambulatory Visit: Payer: Self-pay | Admitting: Anesthesiology

## 2024-06-29 ENCOUNTER — Encounter
Admission: RE | Disposition: A | Discharge status: Home / Self Care | Payer: Self-pay | Source: Home / Self Care | Attending: Ophthalmology

## 2024-06-29 ENCOUNTER — Other Ambulatory Visit: Payer: Self-pay

## 2024-06-29 ENCOUNTER — Encounter: Payer: Self-pay | Admitting: Ophthalmology

## 2024-06-29 ENCOUNTER — Ambulatory Visit
Admission: RE | Admit: 2024-06-29 | Discharge: 2024-06-29 | Disposition: A | Discharge status: Home / Self Care | Attending: Ophthalmology | Admitting: Ophthalmology

## 2024-06-29 DIAGNOSIS — K219 Gastro-esophageal reflux disease without esophagitis: Secondary | ICD-10-CM | POA: Diagnosis not present

## 2024-06-29 DIAGNOSIS — E66813 Obesity, class 3: Secondary | ICD-10-CM | POA: Insufficient documentation

## 2024-06-29 DIAGNOSIS — I10 Essential (primary) hypertension: Secondary | ICD-10-CM | POA: Insufficient documentation

## 2024-06-29 DIAGNOSIS — Z853 Personal history of malignant neoplasm of breast: Secondary | ICD-10-CM | POA: Diagnosis not present

## 2024-06-29 DIAGNOSIS — Z6841 Body Mass Index (BMI) 40.0 and over, adult: Secondary | ICD-10-CM | POA: Insufficient documentation

## 2024-06-29 DIAGNOSIS — Z923 Personal history of irradiation: Secondary | ICD-10-CM | POA: Diagnosis not present

## 2024-06-29 DIAGNOSIS — G4730 Sleep apnea, unspecified: Secondary | ICD-10-CM | POA: Diagnosis not present

## 2024-06-29 DIAGNOSIS — R7303 Prediabetes: Secondary | ICD-10-CM | POA: Insufficient documentation

## 2024-06-29 DIAGNOSIS — H2512 Age-related nuclear cataract, left eye: Secondary | ICD-10-CM | POA: Insufficient documentation

## 2024-06-29 DIAGNOSIS — M1990 Unspecified osteoarthritis, unspecified site: Secondary | ICD-10-CM | POA: Diagnosis not present

## 2024-06-29 HISTORY — PX: CATARACT EXTRACTION W/PHACO: SHX586

## 2024-06-29 SURGERY — PHACOEMULSIFICATION, CATARACT, WITH IOL INSERTION
Anesthesia: Monitor Anesthesia Care | Site: Eye | Laterality: Left

## 2024-06-29 MED ORDER — MIDAZOLAM HCL (PF) 2 MG/2ML IJ SOLN
INTRAMUSCULAR | Status: DC | PRN
  Administered 2024-06-29: 2 mg via INTRAVENOUS

## 2024-06-29 MED ORDER — LACTATED RINGERS IV SOLN: INTRAVENOUS | Status: DC

## 2024-06-29 MED ORDER — SIGHTPATH DOSE#1 BSS IO SOLN
INTRAOCULAR | Status: DC | PRN
  Administered 2024-06-29: 15 mL via INTRAOCULAR

## 2024-06-29 MED ORDER — TETRACAINE HCL 0.5 % OP SOLN
OPHTHALMIC | Status: AC
  Filled 2024-06-29: qty 4

## 2024-06-29 MED ORDER — FENTANYL CITRATE (PF) 100 MCG/2ML IJ SOLN
INTRAMUSCULAR | Status: DC | PRN
  Administered 2024-06-29: 50 ug via INTRAVENOUS

## 2024-06-29 MED ORDER — MOXIFLOXACIN HCL 0.5 % OP SOLN
OPHTHALMIC | Status: DC | PRN
Start: 2024-06-29 — End: 2024-06-29
  Administered 2024-06-29: .2 mL via OPHTHALMIC

## 2024-06-29 MED ORDER — ONDANSETRON HCL 4 MG/2ML IJ SOLN: 4.0000 mg | Freq: Once | INTRAMUSCULAR | Status: DC | PRN

## 2024-06-29 MED ORDER — SIGHTPATH DOSE#1 BSS IO SOLN
INTRAOCULAR | Status: DC | PRN
  Administered 2024-06-29: 41 mL via OPHTHALMIC

## 2024-06-29 MED ORDER — PHENYLEPHRINE HCL 10 % OP SOLN
OPHTHALMIC | Status: AC
Start: 2024-06-29 — End: 2024-06-29
  Filled 2024-06-29: qty 5

## 2024-06-29 MED ORDER — LIDOCAINE HCL (PF) 2 % IJ SOLN
INTRAMUSCULAR | Status: DC | PRN
  Administered 2024-06-29: 2 mL

## 2024-06-29 MED ORDER — CYCLOPENTOLATE HCL 2 % OP SOLN
1.0000 [drp] | OPHTHALMIC | Status: AC
Start: 2024-06-29 — End: 2024-06-29
  Administered 2024-06-29 (×2): 1 [drp] via OPHTHALMIC

## 2024-06-29 MED ORDER — BRIMONIDINE TARTRATE-TIMOLOL 0.2-0.5 % OP SOLN
OPHTHALMIC | Status: DC | PRN
  Administered 2024-06-29: 1 [drp] via OPHTHALMIC

## 2024-06-29 MED ORDER — PHENYLEPHRINE HCL 10 % OP SOLN
1.0000 [drp] | OPHTHALMIC | Status: AC
  Administered 2024-06-29 (×3): 1 [drp] via OPHTHALMIC

## 2024-06-29 MED ORDER — MIDAZOLAM HCL 2 MG/2ML IJ SOLN
INTRAMUSCULAR | Status: AC
  Filled 2024-06-29: qty 2

## 2024-06-29 MED ORDER — TETRACAINE HCL 0.5 % OP SOLN
1.0000 [drp] | OPHTHALMIC | Status: DC | PRN
  Administered 2024-06-29 (×3): 1 [drp] via OPHTHALMIC

## 2024-06-29 MED ORDER — CYCLOPENTOLATE HCL 2 % OP SOLN
OPHTHALMIC | Status: AC
  Filled 2024-06-29: qty 2

## 2024-06-29 MED ORDER — FENTANYL CITRATE (PF) 100 MCG/2ML IJ SOLN
INTRAMUSCULAR | Status: AC
  Filled 2024-06-29: qty 2

## 2024-06-29 MED ORDER — SIGHTPATH DOSE#1 NA CHONDROIT SULF-NA HYALURON 40-17 MG/ML IO SOLN
INTRAOCULAR | Status: DC | PRN
  Administered 2024-06-29: 1 mL via INTRAOCULAR

## 2024-06-29 SURGICAL SUPPLY — 10 items
CANNULA ANT/CHMB 27G (MISCELLANEOUS) ×1 IMPLANT
CYSTOTOME ANGL RVRS SHRT 25G (CUTTER) ×1 IMPLANT
FEE CATARACT SUITE SIGHTPATH (MISCELLANEOUS) ×1 IMPLANT
GLOVE BIOGEL PI IND STRL 8 (GLOVE) ×1 IMPLANT
GLOVE SURG LX STRL 8.0 MICRO (GLOVE) ×1 IMPLANT
GLOVE SURG SYN 6.5 PF PI BL (GLOVE) ×1 IMPLANT
LENS IOL TECNIS EYHANCE 20.0 (Intraocular Lens) IMPLANT
NDL FILTER BLUNT 18X1 1/2 (NEEDLE) ×1 IMPLANT
NEEDLE FILTER BLUNT 18X1 1/2 (NEEDLE) ×1 IMPLANT
SYR 3ML LL SCALE MARK (SYRINGE) ×1 IMPLANT

## 2024-06-29 NOTE — Anesthesia Preprocedure Evaluation (Addendum)
 Anesthesia Evaluation  Patient identified by MRN, date of birth, ID band Patient awake    Reviewed: Allergy & Precautions, NPO status , Patient's Chart, lab work & pertinent test results  History of Anesthesia Complications Negative for: history of anesthetic complications  Airway Mallampati: II   Neck ROM: Full    Dental  (+) Missing   Pulmonary sleep apnea    Pulmonary exam normal breath sounds clear to auscultation       Cardiovascular hypertension, Normal cardiovascular exam Rhythm:Regular Rate:Normal     Neuro/Psych  Headaches    GI/Hepatic ,GERD  ,,  Endo/Other    Class 3 obesityPrediabetes   Renal/GU Renal disease (nephrolithiasis)     Musculoskeletal  (+) Arthritis ,    Abdominal   Peds  Hematology Breast CA   Anesthesia Other Findings   Reproductive/Obstetrics                              Anesthesia Physical Anesthesia Plan  ASA: 3  Anesthesia Plan: MAC   Post-op Pain Management:    Induction: Intravenous  PONV Risk Score and Plan: 2 and Treatment may vary due to age or medical condition, Midazolam  and TIVA  Airway Management Planned: Natural Airway and Nasal Cannula  Additional Equipment:   Intra-op Plan:   Post-operative Plan:   Informed Consent: I have reviewed the patients History and Physical, chart, labs and discussed the procedure including the risks, benefits and alternatives for the proposed anesthesia with the patient or authorized representative who has indicated his/her understanding and acceptance.     Dental advisory given  Plan Discussed with: CRNA  Anesthesia Plan Comments: (LMA/GETA backup discussed.  Patient consented for risks of anesthesia including but not limited to:  - adverse reactions to medications - damage to eyes, teeth, lips or other oral mucosa - nerve damage due to positioning  - sore throat or hoarseness - damage to heart,  brain, nerves, lungs, other parts of body or loss of life  Informed patient about role of CRNA in peri- and intra-operative care.  Patient voiced understanding.)         Anesthesia Quick Evaluation

## 2024-06-29 NOTE — Transfer of Care (Signed)
 Immediate Anesthesia Transfer of Care Note  Patient: Lisa Jennings  Procedure(s) Performed: PHACOEMULSIFICATION, CATARACT, WITH IOL INSERTION 4.66 00:28.0 (Left: Eye)  Patient Location: PACU  Anesthesia Type: MAC  Level of Consciousness: awake, alert  and patient cooperative  Airway and Oxygen Therapy: Patient Spontanous Breathing and Patient connected to supplemental oxygen  Post-op Assessment: Post-op Vital signs reviewed, Patient's Cardiovascular Status Stable, Respiratory Function Stable, Patent Airway and No signs of Nausea or vomiting  Post-op Vital Signs: Reviewed and stable  Complications: No notable events documented.

## 2024-06-29 NOTE — Op Note (Signed)
 PREOPERATIVE DIAGNOSIS:  Nuclear sclerotic cataract of the left eye.   POSTOPERATIVE DIAGNOSIS:  Nuclear sclerotic cataract of the left eye.   OPERATIVE PROCEDURE:ORPROCALL@   SURGEON:  Elsie Carmine, MD.   ANESTHESIA:  Anesthesiologist: Shellie Odor, MD CRNA: Levy Harvey, CRNA  1.      Managed anesthesia care. 2.     0.9ml of Shugarcaine was instilled following the paracentesis   COMPLICATIONS:  None.   TECHNIQUE:   Stop and chop   DESCRIPTION OF PROCEDURE:  The patient was examined and consented in the preoperative holding area where the aforementioned topical anesthesia was applied to the left eye and then brought back to the Operating Room where the left eye was prepped and draped in the usual sterile ophthalmic fashion and a lid speculum was placed. A paracentesis was created with the side port blade and the anterior chamber was filled with viscoelastic. A near clear corneal incision was performed with the steel keratome. A continuous curvilinear capsulorrhexis was performed with a cystotome followed by the capsulorrhexis forceps. Hydrodissection and hydrodelineation were carried out with BSS on a blunt cannula. The lens was removed in a stop and chop  technique and the remaining cortical material was removed with the irrigation-aspiration handpiece. The capsular bag was inflated with viscoelastic and the intraocular lens was placed in the capsular bag without complication. The remaining viscoelastic was removed from the eye with the irrigation-aspiration handpiece. The wounds were hydrated. The anterior chamber was flushed with BSS and the eye was inflated to physiologic pressure. 0.75ml Vigamox was placed in the anterior chamber. The wounds were found to be water tight. The eye was dressed with Combigan. The patient was given protective glasses to wear throughout the day and a shield with which to sleep tonight. The patient was also given drops with which to begin a drop regimen today  and will follow-up with me in one day. Implant Name Type Inv. Item Serial No. Manufacturer Lot No. LRB No. Used Action  LENS IOL TECNIS EYHANCE 20.0 - [id number] Intraocular Lens LENS IOL TECNIS EYHANCE 20.0 [id number] [employer]  Left 1 Implanted    Procedure(s): PHACOEMULSIFICATION, CATARACT, WITH IOL INSERTION 4.66 00:28.0 (Left)  Electronically signed: Elsie Carmine 06/29/2024 8:39 AM

## 2024-06-29 NOTE — H&P (Signed)
 [facility]   Primary Care Physician:  Lauran Hails Primary Care Ophthalmologist: Dr. Elsie Carmine  Pre-Procedure History & Physical: HPI:  Lisa Jennings is a 63 y.o. female here for cataract surgery.   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   Closed fracture of neck of right radius    Closed fracture of scaphoid bone of wrist    De Quervain's tenosynovitis, right    Family history of adverse reaction to anesthesia    nausea and vomiting   GERD (gastroesophageal reflux disease)    Headache    History of kidney stones    Hypertension    Impingement syndrome of right shoulder    Lateral epicondylitis    Morton's neuroma of left foot    Personal history of radiation therapy 2020   Right lumpectomy   Pneumonia    Pre-diabetes    Sleep apnea    USES CPAP   Trigger middle finger of left hand     Past Surgical History:  Procedure Laterality Date   APPENDECTOMY     BIOPSY  10/03/2023   Procedure: BIOPSY;  Surgeon: Maryruth Ole DASEN, MD;  Location: [facility];  Service: Endoscopy;;   BREAST BIOPSY Right 2016   neg/ bx -clip   BREAST BIOPSY Right 05/28/2019   x clip, stereo bx, invasive mammary carcinoma    BREAST LUMPECTOMY Right 06/11/2019   INVASIVE MAMMARY CARCINOMA   CATARACT EXTRACTION W/PHACO Right 06/15/2024   Procedure: PHACOEMULSIFICATION, CATARACT, WITH IOL INSERTION 5.50 00:33.7;  Surgeon: Carmine Elsie, MD;  Location[facility];  Service: Ophthalmology;  Laterality: Right;   COLONOSCOPY WITH PROPOFOL  N/A 10/03/2023   Procedure: COLONOSCOPY WITH PROPOFOL ;  Surgeon: Maryruth Ole DASEN, MD;  Location: [facility];  Service: Endoscopy;  Laterality: N/A;   ESOPHAGOGASTRODUODENOSCOPY (EGD) WITH PROPOFOL  N/A 10/03/2023   Procedure:  ESOPHAGOGASTRODUODENOSCOPY (EGD) WITH PROPOFOL ;  Surgeon: Maryruth Ole DASEN, MD;  Location: [facility];  Service: Endoscopy;  Laterality: N/A;   JOINT REPLACEMENT     LITHOTRIPSY     PARTIAL MASTECTOMY WITH NEEDLE LOCALIZATION AND AXILLARY SENTINEL LYMPH NODE BX Right 06/11/2019   Procedure: PARTIAL MASTECTOMY WITH NEEDLE LOCALIZATION AND AXILLARY SENTINEL LYMPH NODE BX;  Surgeon: Rodolph Romano, MD;  Location: [facility];  Service: General;  Laterality: Right;   REPLACEMENT TOTAL KNEE Right 03/21/2021   TENOTOMY HAND / FINGER PERCUTANEOUS     TUBAL LIGATION     WRIST CAPSULOTOMY     WRIST FRACTURE SURGERY      Prior to Admission medications   Medication Sig Start Date End Date Taking? Authorizing Provider  acetaminophen  (TYLENOL ) 500 MG tablet Take 1,000 mg by mouth every 6 (six) hours as needed for moderate pain.   Yes [provider]  aspirin EC 81 MG tablet Take 81 mg by mouth daily.   Yes [provider]  Boswellia-Glucosamine-Vit D (OSTEO BI-FLEX ONE PER DAY PO) Take 1 tablet by mouth daily at 12 noon.   Yes [provider]  letrozole  (FEMARA ) 2.5 MG tablet Take 1 tablet (2.5 mg total) by mouth daily. 02/17/24  Yes Brahmanday, Govinda R, MD  meloxicam (MOBIC) 15 MG tablet Take 15 mg by mouth daily. 04/07/19  Yes [provider]  Multiple Vitamins-Minerals (CENTRUM SILVER  ADULT 50+ PO) Take 1 tablet by mouth  daily.   Yes [provider]  pantoprazole (PROTONIX) 40 MG tablet Take 40 mg by mouth daily. 03/25/23  Yes [provider]  valsartan-hydrochlorothiazide (DIOVAN-HCT) 320-12.5 MG tablet Take 1 tablet by mouth daily.   Yes [provider]  pyridoxine (B-6) 100 MG tablet Take 100 mg by mouth daily.    [provider]    Allergies as of 05/31/2024   (No Known Allergies)    Family History  Problem Relation Age of Onset   Breast cancer Maternal Aunt 22   Diabetes Mother     Social History    Socioeconomic History   Marital status: Divorced    Spouse name: Not on file   Number of children: Not on file   Years of education: Not on file   Highest education level: Not on file  Occupational History   Not on file  Tobacco Use   Smoking status: Never   Smokeless tobacco: Never  Vaping Use   Vaping status: Never Used  Substance and Sexual Activity   Alcohol use: Never   Drug use: Never   Sexual activity: Not Currently  Other Topics Concern   Not on file  Social History Narrative   Lives in [place]/ with brother/ son& family. 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    Social Drivers of Corporate Investment Banker Strain: Low Risk  (11/28/2023)   Received from [facility]   Overall Financial Resource Strain (CARDIA)    Difficulty of Paying Living Expenses: Not hard at all  Food Insecurity: No Food Insecurity (11/28/2023)   Received from [facility]   Hunger Vital Sign    Within the past 12 months, you worried that your food would run out before you got the money to buy more.: Never true    Within the past 12 months, the food you bought just didn't last and you didn't have money to get more.: Never true  Transportation Needs: No Transportation Needs (11/28/2023)   Received from [facility] - Transportation    In the past 12 months, has lack of transportation kept you from medical appointments or from getting medications?: No    Lack of Transportation (Non-Medical): No  Physical Activity: Not on file  Stress: Not on file  Social Connections: Not on file  Intimate Partner Violence: Not on file    Review of Systems: See HPI, otherwise negative ROS  Physical Exam: BP (!) 154/80   Pulse 71   Temp 97.6 F (36.4 C) (Temporal)   Resp 18   Ht 5' 4 (1.626 m)   Wt 108.4 kg   SpO2 97%   BMI 41.02 kg/m  General:   Alert, cooperative. Head:  Normocephalic and atraumatic. Respiratory:  Normal work  of breathing. Cardiovascular:  NAD  Impression/Plan: Lisa Jennings is here for cataract surgery.  Risks, benefits, limitations, and alternatives regarding cataract surgery have been reviewed with the patient.  Questions have been answered.  All parties agreeable.   Elsie Carmine, MD  06/29/2024, 8:18 AM

## 2024-06-29 NOTE — Addendum Note (Signed)
 Addendum  created 06/29/24 0902 by Bridger Pizzi, MD   Attestation recorded in Intraprocedure, Intraprocedure Attestations filed

## 2024-06-29 NOTE — Anesthesia Postprocedure Evaluation (Signed)
 Anesthesia Post Note  Patient: Torrence Joshua Hsu  Procedure(s) Performed: PHACOEMULSIFICATION, CATARACT, WITH IOL INSERTION 4.66 00:28.0 (Left: Eye)  Patient location during evaluation: PACU Anesthesia Type: MAC Level of consciousness: awake and alert, oriented and patient cooperative Pain management: pain level controlled Vital Signs Assessment: post-procedure vital signs reviewed and stable Respiratory status: spontaneous breathing, nonlabored ventilation and respiratory function stable Cardiovascular status: blood pressure returned to baseline and stable Postop Assessment: adequate PO intake Anesthetic complications: no   No notable events documented.   Last Vitals:  Vitals:   06/29/24 0840 06/29/24 0844  BP: (!) 153/73 (!) 140/66  Pulse: 69 67  Resp: 15 12  Temp: (!) 36.3 C (!) 36.3 C  SpO2: 98% 98%    Last Pain:  Vitals:   06/29/24 0844  TempSrc:   PainSc: 0-No pain                 Alfonso Ruths

## 2024-08-29 ENCOUNTER — Other Ambulatory Visit: Payer: Self-pay | Admitting: Internal Medicine

## 2024-09-08 ENCOUNTER — Telehealth: Payer: Self-pay | Admitting: Internal Medicine

## 2024-09-08 ENCOUNTER — Encounter: Payer: Self-pay | Admitting: Internal Medicine

## 2024-09-08 ENCOUNTER — Inpatient Hospital Stay: Admitting: Internal Medicine

## 2024-09-08 ENCOUNTER — Inpatient Hospital Stay: Attending: Internal Medicine

## 2024-09-08 VITALS — BP 132/68 | HR 81 | Temp 97.6°F | Resp 16 | Ht 64.0 in | Wt 246.2 lb

## 2024-09-08 DIAGNOSIS — Z79811 Long term (current) use of aromatase inhibitors: Secondary | ICD-10-CM

## 2024-09-08 DIAGNOSIS — C50211 Malignant neoplasm of upper-inner quadrant of right female breast: Secondary | ICD-10-CM | POA: Insufficient documentation

## 2024-09-08 DIAGNOSIS — Z1732 Human epidermal growth factor receptor 2 negative status: Secondary | ICD-10-CM | POA: Diagnosis not present

## 2024-09-08 DIAGNOSIS — R232 Flushing: Secondary | ICD-10-CM

## 2024-09-08 DIAGNOSIS — Z803 Family history of malignant neoplasm of breast: Secondary | ICD-10-CM | POA: Insufficient documentation

## 2024-09-08 DIAGNOSIS — R42 Dizziness and giddiness: Secondary | ICD-10-CM | POA: Diagnosis not present

## 2024-09-08 DIAGNOSIS — Z170 Estrogen receptor positive status [ER+]: Secondary | ICD-10-CM | POA: Diagnosis not present

## 2024-09-08 DIAGNOSIS — Z1721 Progesterone receptor positive status: Secondary | ICD-10-CM | POA: Diagnosis not present

## 2024-09-08 LAB — CBC WITH DIFFERENTIAL (CANCER CENTER ONLY)
Abs Immature Granulocytes: 0.04 K/uL (ref 0.00–0.07)
Basophils Absolute: 0 K/uL (ref 0.0–0.1)
Basophils Relative: 0 %
Eosinophils Absolute: 0.2 K/uL (ref 0.0–0.5)
Eosinophils Relative: 2 %
HCT: 39 % (ref 36.0–46.0)
Hemoglobin: 13.4 g/dL (ref 12.0–15.0)
Immature Granulocytes: 1 %
Lymphocytes Relative: 23 %
Lymphs Abs: 1.8 K/uL (ref 0.7–4.0)
MCH: 30.8 pg (ref 26.0–34.0)
MCHC: 34.4 g/dL (ref 30.0–36.0)
MCV: 89.7 fL (ref 80.0–100.0)
Monocytes Absolute: 0.5 K/uL (ref 0.1–1.0)
Monocytes Relative: 6 %
Neutro Abs: 5.4 K/uL (ref 1.7–7.7)
Neutrophils Relative %: 68 %
Platelet Count: 259 K/uL (ref 150–400)
RBC: 4.35 MIL/uL (ref 3.87–5.11)
RDW: 13.2 % (ref 11.5–15.5)
WBC Count: 7.9 K/uL (ref 4.0–10.5)
nRBC: 0 % (ref 0.0–0.2)

## 2024-09-08 LAB — CMP (CANCER CENTER ONLY)
ALT: 24 U/L (ref 0–44)
AST: 27 U/L (ref 15–41)
Albumin: 4.5 g/dL (ref 3.5–5.0)
Alkaline Phosphatase: 104 U/L (ref 38–126)
Anion gap: 13 (ref 5–15)
BUN: 18 mg/dL (ref 8–23)
CO2: 25 mmol/L (ref 22–32)
Calcium: 10.5 mg/dL — ABNORMAL HIGH (ref 8.9–10.3)
Chloride: 102 mmol/L (ref 98–111)
Creatinine: 0.84 mg/dL (ref 0.44–1.00)
GFR, Estimated: >60 mL/min
Glucose, Bld: 99 mg/dL (ref 70–99)
Potassium: 3.6 mmol/L (ref 3.5–5.1)
Sodium: 140 mmol/L (ref 135–145)
Total Bilirubin: 0.4 mg/dL (ref 0.0–1.2)
Total Protein: 7.8 g/dL (ref 6.5–8.1)

## 2024-09-08 LAB — VITAMIN D 25 HYDROXY (VIT D DEFICIENCY, FRACTURES): Vit D, 25-Hydroxy: 58.2 ng/mL (ref 30–100)

## 2024-09-08 MED ORDER — MECLIZINE HCL 25 MG PO TABS: ORAL_TABLET | ORAL | 0 refills | Status: AC

## 2024-09-08 NOTE — Telephone Encounter (Signed)
 Patient scheduled per office note:  6 months- MD: labs- cbc/cmp; Vit D 25-OH  Scheduled and confirmed with patient

## 2024-09-08 NOTE — Progress Notes (Signed)
 [facility] CONSULT NOTE  Patient Care Team: [place], [facility] as PCP - General Rennie Cindy SAUNDERS, MD as Consulting Physician (Oncology) Rodolph Romano, MD as Consulting Physician (General Surgery) Lenn Aran, MD as Referring Physician (Radiation Oncology)  CHIEF COMPLAINTS/PURPOSE OF CONSULTATION: Breast cancer  #  Oncology History Overview Note  # OCT 2020-RIGHT BREAST, UPPER INNER QUADRANT;  INVASIVE MAMMARY CARCINOMA, NO SPECIAL TYPE. S/p Lumpectomy & SLNBx [Dr.Cintron]pT1b pN0; G-1; ER/PR >90%; her 2neu-NEG;   # feb 1st 2021- Arimidex ; STOPPED OCT 2023; sec to [facility]- NOV Started Examestane; Worsening [facility] pain- recommend DISCONTINUE exemestane  - JAN 2025.   MARCH 2025- Start Letrozole   # survivorship: pending; LMP- 2011/ No TAH or BSO  DIAGNOSIS: Right breast cancer  STAGE:    1     ;  GOALS: Cure     Carcinoma of upper-inner quadrant of right breast in female, estrogen receptor positive (HCC)  06/07/2019 Initial Diagnosis   Carcinoma of upper-inner quadrant of right breast in female, estrogen receptor positive (HCC)    HISTORY OF PRESENTING ILLNESS: Alone.  Independently.  Lisa Jennings 64 y.o.  female with above history of of right breast cancer stage I ER/PRpos; her2 NEG on letrozole   is here for follow-up.  Discussed the use of AI scribe software for clinical note transcription with the patient, who gave verbal consent to proceed.  History of Present Illness   Lisa Jennings is a 64 year old female with estrogen receptor positive right breast carcinoma on letrozole  who presents for oncology follow-up and evaluation of vertigo and persistent arthralgia.  She continues letrozole  therapy for ER-positive right breast cancer without new or unusual adverse effects. She reports having a mammogram and bone density testing in October. She does not take calcium supplements due to previously fluctuating calcium levels. Hot  flashes and arthralgia persist but are stable and unchanged.  She describes new-onset dizziness this week, characterized as a sensation of 'swimming head.' On Sunday morning, she experienced an episode of vertigo upon rising from bed, resulting in a fall back onto the bed. At work, she notes transient vision loss described as 'everything turns black' when extending her neck and then returning to neutral position, requiring her to hold onto a step stool until her vision normalizes. She denies nausea or vomiting. She expresses concern for her safety at work due to these symptoms.  She had a fall in November with resultant bruising but no fractures, and continues to have persistent pain in the arm previously fractured two years ago.      Review of Systems  Constitutional:  Negative for chills, diaphoresis, fever, malaise/fatigue and weight loss.  HENT:  Negative for nosebleeds and sore throat.   Eyes:  Negative for double vision.  Respiratory:  Negative for cough, hemoptysis, sputum production, shortness of breath and wheezing.   Cardiovascular:  Negative for chest pain, palpitations, orthopnea and leg swelling.  Gastrointestinal:  Negative for abdominal pain, blood in stool, constipation, diarrhea, heartburn, melena, nausea and vomiting.  Genitourinary:  Negative for dysuria, frequency and urgency.  Musculoskeletal:  Positive for back pain and joint pain.  Skin: Negative.  Negative for itching and rash.  Neurological:  Negative for dizziness, tingling, focal weakness, weakness and headaches.  Endo/Heme/Allergies:  Does not bruise/bleed easily.  Psychiatric/Behavioral:  Negative for depression. The patient is not nervous/anxious and does not have insomnia.      MEDICAL HISTORY:  Past Medical History:  Diagnosis Date   Achilles tendinitis of right lower  extremity    Arthritis    Breast cancer (HCC)    Carcinoma of upper-inner quadrant of right breast in female, estrogen receptor positive  (HCC) 06/07/2019   Closed fracture of neck of right radius    Closed fracture of scaphoid bone of wrist    De Quervain's tenosynovitis, right    Family history of adverse reaction to anesthesia    nausea and vomiting   GERD (gastroesophageal reflux disease)    Headache    History of kidney stones    Hypertension    Impingement syndrome of right shoulder    Lateral epicondylitis    Morton's neuroma of left foot    Personal history of radiation therapy 2020   Right lumpectomy   Pneumonia    Pre-diabetes    Sleep apnea    USES CPAP   Trigger middle finger of left hand     SURGICAL HISTORY: Past Surgical History:  Procedure Laterality Date   APPENDECTOMY     BIOPSY  10/03/2023   Procedure: BIOPSY;  Surgeon: Maryruth Ole DASEN, MD;  Location: [facility];  Service: Endoscopy;;   BREAST BIOPSY Right 2016   neg/ bx -clip   BREAST BIOPSY Right 05/28/2019   x clip, stereo bx, invasive mammary carcinoma    BREAST LUMPECTOMY Right 06/11/2019   INVASIVE MAMMARY CARCINOMA   CATARACT EXTRACTION W/PHACO Right 06/15/2024   Procedure: PHACOEMULSIFICATION, CATARACT, WITH IOL INSERTION 5.50 00:33.7;  Surgeon: Jaye Fallow, MD;  Location: [facility];  Service: Ophthalmology;  Laterality: Right;   CATARACT EXTRACTION W/PHACO Left 06/29/2024   Procedure: PHACOEMULSIFICATION, CATARACT, WITH IOL INSERTION 4.66 00:28.0;  Surgeon: Jaye Fallow, MD;  Location: [facility];  Service: Ophthalmology;  Laterality: Left;   COLONOSCOPY WITH PROPOFOL  N/A 10/03/2023   Procedure: COLONOSCOPY WITH PROPOFOL ;  Surgeon: Maryruth Ole DASEN, MD;  Location: [facility];  Service: Endoscopy;  Laterality: N/A;   ESOPHAGOGASTRODUODENOSCOPY (EGD) WITH PROPOFOL  N/A 10/03/2023   Procedure: ESOPHAGOGASTRODUODENOSCOPY (EGD) WITH PROPOFOL ;  Surgeon: Maryruth Ole DASEN, MD;  Location: [facility];  Service: Endoscopy;  Laterality: N/A;   JOINT REPLACEMENT     LITHOTRIPSY     PARTIAL MASTECTOMY  WITH NEEDLE LOCALIZATION AND AXILLARY SENTINEL LYMPH NODE BX Right 06/11/2019   Procedure: PARTIAL MASTECTOMY WITH NEEDLE LOCALIZATION AND AXILLARY SENTINEL LYMPH NODE BX;  Surgeon: Rodolph Romano, MD;  Location: [facility];  Service: General;  Laterality: Right;   REPLACEMENT TOTAL KNEE Right 03/21/2021   TENOTOMY HAND / FINGER PERCUTANEOUS     TUBAL LIGATION     WRIST CAPSULOTOMY     WRIST FRACTURE SURGERY      SOCIAL HISTORY: Social History   Socioeconomic History   Marital status: Divorced    Spouse name: Not on file   Number of children: Not on file   Years of education: Not on file   Highest education level: Not on file  Occupational History   Not on file  Tobacco Use   Smoking status: Never   Smokeless tobacco: Never  Vaping Use   Vaping status: Never Used  Substance and Sexual Activity   Alcohol use: Never   Drug use: Never   Sexual activity: Not Currently  Other Topics Concern   Not on file  Social History Narrative   Lives in [place]/ with brother/ son& family. 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    Social Drivers of Health   Tobacco Use: Low Risk (09/08/2024)   Patient History    Smoking Tobacco Use: Never  Smokeless Tobacco Use: Never    Passive Exposure: Not on file  Financial Resource Strain: Low Risk  (11/28/2023)   Received from [facility]   Overall Financial Resource Strain (CARDIA)    Difficulty of Paying Living Expenses: Not hard at all  Food Insecurity: No Food Insecurity (11/28/2023)   Received from [facility]   Epic    Within the past 12 months, you worried that your food would run out before you got the money to buy more.: Never true    Within the past 12 months, the food you bought just didn't last and you didn't have money to get more.: Never true  Transportation Needs: No Transportation Needs (11/28/2023)   Received from [facility] - Transportation    In the  past 12 months, has lack of transportation kept you from medical appointments or from getting medications?: No    Lack of Transportation (Non-Medical): No  Physical Activity: Not on file  Stress: Not on file  Social Connections: Not on file  Intimate Partner Violence: Not on file  Depression (PHQ2-9): Low Risk (02/17/2024)   Depression (PHQ2-9)    PHQ-2 Score: 0  Alcohol Screen: Not on file  Housing: Low Risk  (12/05/2023)   Received from [facility]   Epic    In the last 12 months, was there a time when you were not able to pay the mortgage or rent on time?: No    In the past 12 months, how many times have you moved where you were living?: 0    At any time in the past 12 months, were you homeless or living in a shelter (including now)?: No  Utilities: Not At Risk (11/28/2023)   Received from [facility] Utilities    Threatened with loss of utilities: No  Health Literacy: Not on file    FAMILY HISTORY: Family History  Problem Relation Age of Onset   Breast cancer Maternal Aunt 52   Diabetes Mother     ALLERGIES:  has no known allergies.  MEDICATIONS:  Current Outpatient Medications  Medication Sig Dispense Refill   acetaminophen  (TYLENOL ) 500 MG tablet Take 1,000 mg by mouth every 6 (six) hours as needed for moderate pain.     aspirin EC 81 MG tablet Take 81 mg by mouth daily.     Boswellia-Glucosamine-Vit D (OSTEO BI-FLEX ONE PER DAY PO) Take 1 tablet by mouth daily at 12 noon.     letrozole  (FEMARA ) 2.5 MG tablet TAKE 1 TABLET BY MOUTH EVERY DAY 90 tablet 1   meclizine  (ANTIVERT ) 25 MG tablet Take one pill; and if the dizziness not improved can take up to 1 pill  every 8 hours as needed. 30 tablet 0   meloxicam (MOBIC) 15 MG tablet Take 15 mg by mouth daily.     Multiple Vitamins-Minerals (CENTRUM SILVER  ADULT 50+ PO) Take 1 tablet by mouth daily.     pyridoxine (B-6) 100 MG tablet Take 100 mg by mouth daily.      valsartan-hydrochlorothiazide (DIOVAN-HCT) 320-12.5 MG tablet Take 1 tablet by mouth daily.     pantoprazole (PROTONIX) 40 MG tablet Take 40 mg by mouth daily. (Patient not taking: Reported on 09/08/2024)     No current facility-administered medications for this visit.      SABRA  PHYSICAL EXAMINATION: ECOG PERFORMANCE STATUS: 0 - Asymptomatic  Vitals:   09/08/24 1010  BP: 132/68  Pulse: 81  Resp: 16  Temp: 97.6 F (36.4 C)  SpO2: 94%   Filed Weights   09/08/24 1010  Weight: 246 lb 3.2 oz (111.7 kg)    Physical Exam HENT:     Head: Normocephalic and atraumatic.     Mouth/Throat:     Pharynx: No oropharyngeal exudate.  Eyes:     Pupils: Pupils are equal, round, and reactive to light.  Cardiovascular:     Rate and Rhythm: Normal rate and regular rhythm.  Pulmonary:     Effort: No respiratory distress.     Breath sounds: No wheezing.  Abdominal:     General: Bowel sounds are normal. There is no distension.     Palpations: Abdomen is soft. There is no mass.     Tenderness: There is no abdominal tenderness. There is no guarding or rebound.  Musculoskeletal:        General: No tenderness. Normal range of motion.     Cervical back: Normal range of motion and neck supple.  Skin:    General: Skin is warm.  Neurological:     Mental Status: She is alert and oriented to person, place, and time.  Psychiatric:        Mood and Affect: Affect normal.      LABORATORY DATA:  I have reviewed the data as listed Lab Results  Component Value Date   WBC 7.9 09/08/2024   HGB 13.4 09/08/2024   HCT 39.0 09/08/2024   MCV 89.7 09/08/2024   PLT 259 09/08/2024   Recent Labs    10/06/23 1005 09/08/24 0953  NA 138 140  K 3.7 3.6  CL 102 102  CO2 24 25  GLUCOSE 94 99  BUN 17 18  CREATININE 0.81 0.84  CALCIUM 9.6 10.5*  GFRNONAA >60 >60  PROT 8.0 7.8  ALBUMIN 4.4 4.5  AST 30 27  ALT 28 24  ALKPHOS 106 104  BILITOT 0.9 0.4    RADIOGRAPHIC STUDIES: I have personally  reviewed the radiological images as listed and agreed with the findings in the report. No results found.   ASSESSMENT & PLAN:   Carcinoma of upper-inner quadrant of right breast in female, estrogen receptor positive (HCC) # 2020- Stage I ER PR positive HER-2 negative breast cancer- Mammo-Dr.Cintron/oct 2025- -reviewed; within normal limits except for seroma. Arimidex  [feb 1st, 2021].  On exemestane  [nov 2023; until spring 2026]-DISCONTINUED sec to [facility] 2025. Started Letrozole .   # tolerating well with mild to moderate side effects- if worse- then consider tamoxifen. Continue letrozole  for now.   # MSK pain- improved/ stable on Letrozole .   # Hot flashes G-1-sec to AI- monitor for now- stable.   # JAN 2025- BMD- T-score of -0.6. Normal- HOLD calcium plus vitamin D -sec to int remittent hypercalcemia-  stable.  # Mild intermittent Hypercalcemia- Ca 10.8- monitor for now. NO ca+vit D [on HCTZ]. 2025-PTH- NEG  # Vertigo- recommend meclizine  prn.   # Screening: JAN 2025- colonoscopy/ EGD [[facility]]- NEG.   # DISPOSITION:  # Follow up in  6 months- MD: labs- cbc/cmp; Vit D 25-OH-  Dr.B  All questions were answered. The patient/family knows to call the clinic with any problems, questions or concerns.    Cindy JONELLE Joe, MD 09/08/2024 11:02 AM

## 2024-09-08 NOTE — Progress Notes (Signed)
 Patient has no concerns

## 2024-09-08 NOTE — Assessment & Plan Note (Signed)
#   2020- Stage I ER PR positive HER-2 negative breast cancer- Mammo-Dr.Cintron/oct 2025- -reviewed; within normal limits except for seroma. Arimidex  [feb 1st, 2021].  On exemestane  [nov 2023; until spring 2026]-DISCONTINUED sec to [facility] 2025. Started Letrozole .   # tolerating well with mild to moderate side effects- if worse- then consider tamoxifen. Continue letrozole  for now.   # MSK pain- improved/ stable on Letrozole .   # Hot flashes G-1-sec to AI- monitor for now- stable.   # JAN 2025- BMD- T-score of -0.6. Normal- HOLD calcium plus vitamin D -sec to int remittent hypercalcemia-  stable.  # Mild intermittent Hypercalcemia- Ca 10.8- monitor for now. NO ca+vit D [on HCTZ]. 2025-PTH- NEG  # Vertigo- recommend meclizine  prn.   # Screening: JAN 2025- colonoscopy/ EGD [[facility]]- NEG.   # DISPOSITION:  # Follow up in  6 months- MD: labs- cbc/cmp; Vit D 25-OH-  Dr.B

## 2025-03-09 ENCOUNTER — Inpatient Hospital Stay

## 2025-03-09 ENCOUNTER — Inpatient Hospital Stay: Admitting: Internal Medicine
